# Patient Record
Sex: Male | Born: 1965 | Race: Black or African American | Hispanic: No | Marital: Married | Smoking: Current every day smoker
Health system: Southern US, Community
[De-identification: ages and names within clinical notes are randomized; demographics above are authoritative.]

## PROBLEM LIST (undated history)

## (undated) DIAGNOSIS — I10 Essential (primary) hypertension: Secondary | ICD-10-CM

## (undated) HISTORY — PX: NECK SURGERY: SHX720

## (undated) HISTORY — PX: SHOULDER SURGERY: SHX246

---

## 1998-10-30 ENCOUNTER — Ambulatory Visit (HOSPITAL_COMMUNITY): Admission: RE | Admit: 1998-10-30 | Discharge: 1998-10-30 | Payer: Self-pay | Admitting: Neurology

## 1998-10-30 ENCOUNTER — Encounter: Payer: Self-pay | Admitting: Neurology

## 1999-04-23 ENCOUNTER — Ambulatory Visit (HOSPITAL_COMMUNITY): Admission: RE | Admit: 1999-04-23 | Discharge: 1999-04-23 | Payer: Self-pay | Admitting: Specialist

## 1999-04-23 ENCOUNTER — Encounter: Payer: Self-pay | Admitting: Specialist

## 2003-06-11 ENCOUNTER — Emergency Department (HOSPITAL_COMMUNITY): Admission: EM | Admit: 2003-06-11 | Discharge: 2003-06-11 | Payer: Self-pay | Admitting: Emergency Medicine

## 2004-07-29 ENCOUNTER — Ambulatory Visit: Payer: Self-pay | Admitting: Family Medicine

## 2005-11-27 IMAGING — US ABDOMEN ULTRASOUND
1 series · 17 of 25 positions shown · non-contrast
Comparison: none

REASON FOR EXAM: Abnormal liver function tests
COMMENTS:

[Series 1: abdomen ultrasound · 17 of 53 slices shown]
[im 1/53]
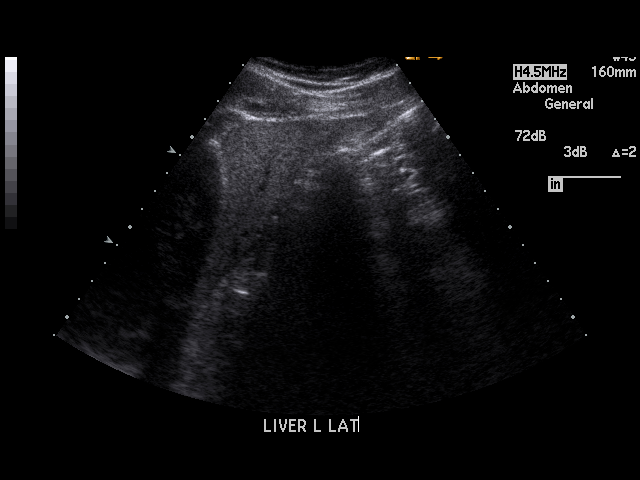
[im 5/53]
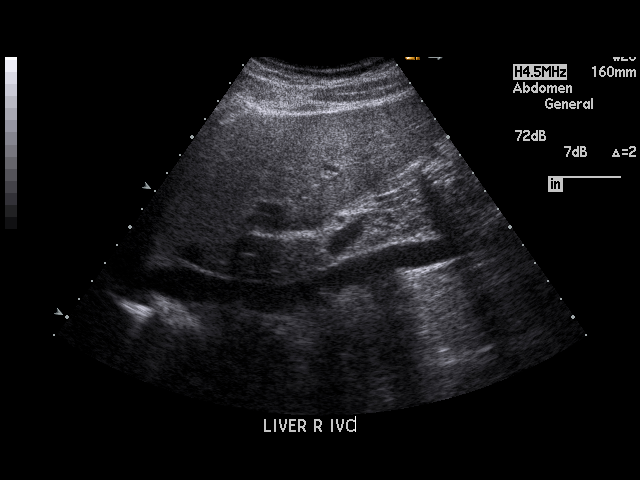
[im 7/53]
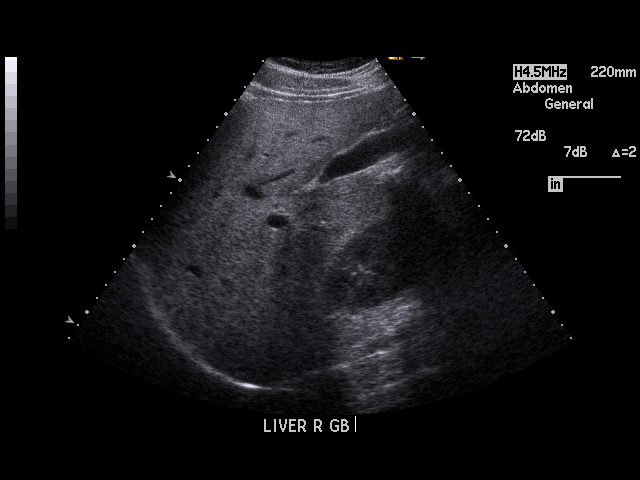
[im 11/53]
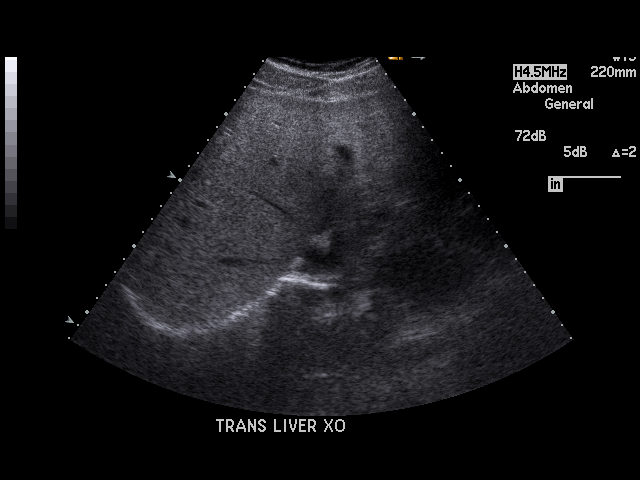
[im 14/53]
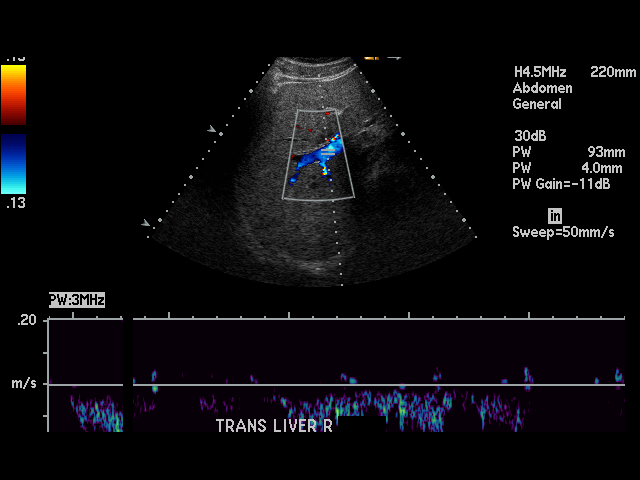
[im 18/53]
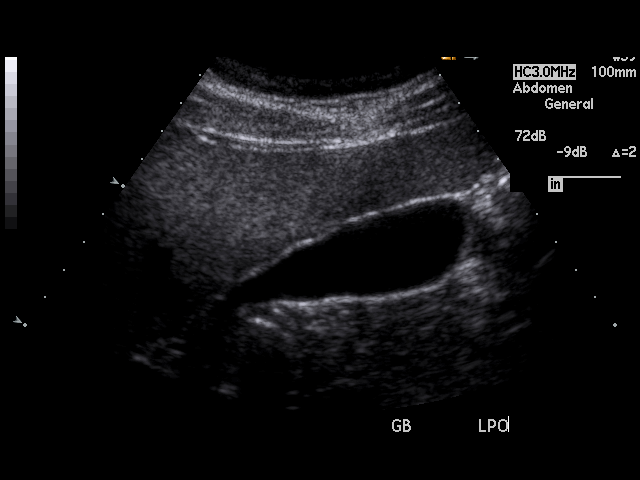
[im 20/53]
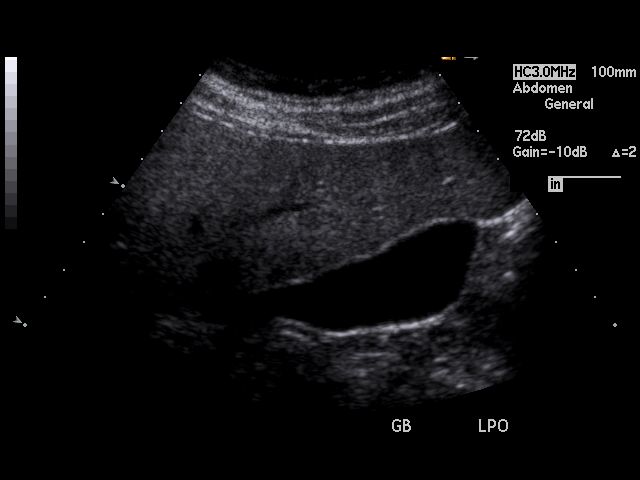
[im 24/53]
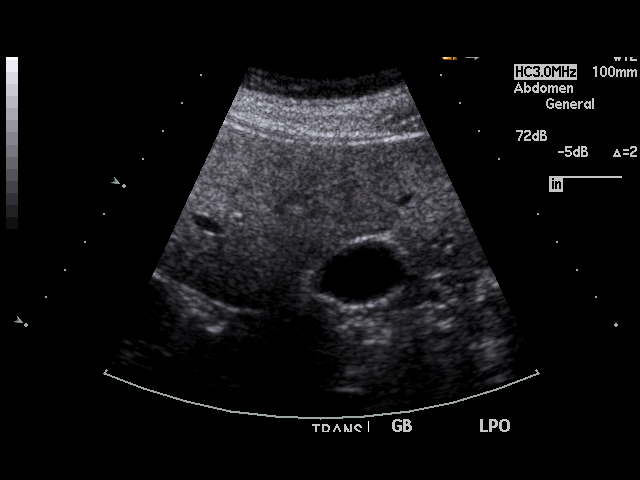
[im 27/53]
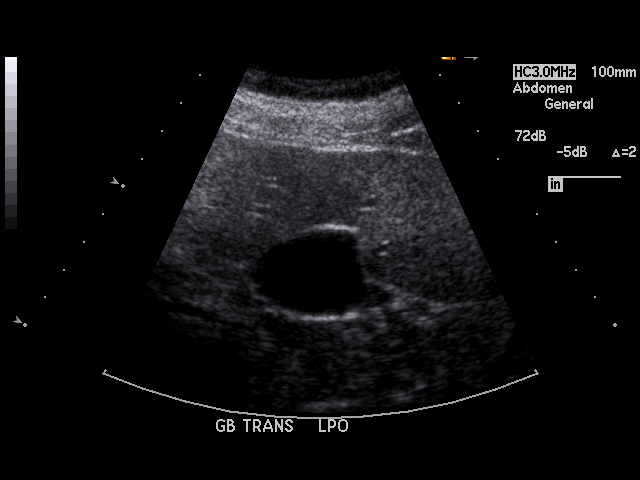
[im 29/53]
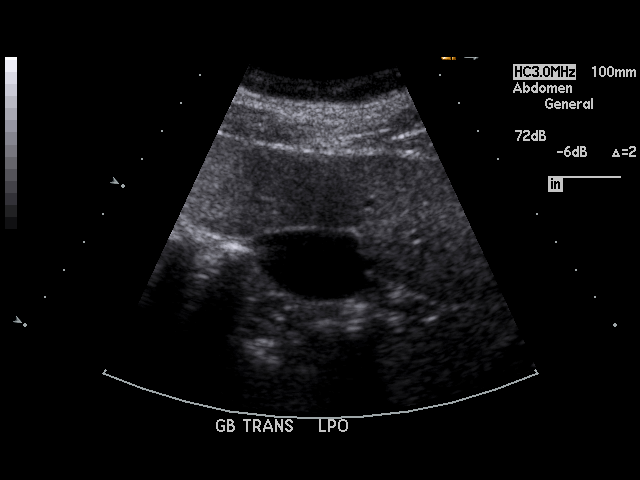
[im 33/53]
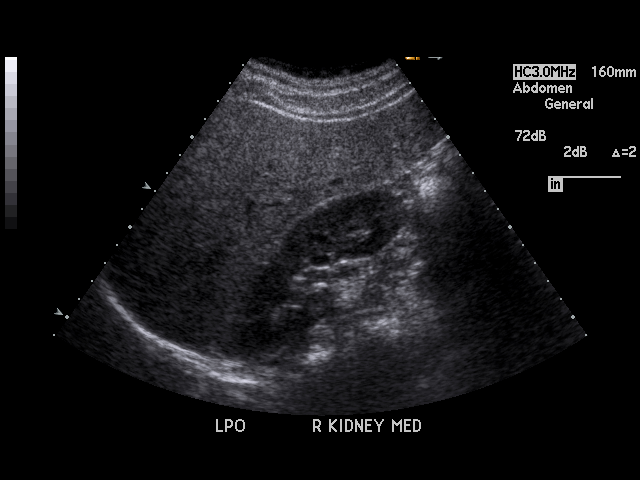
[im 35/53]
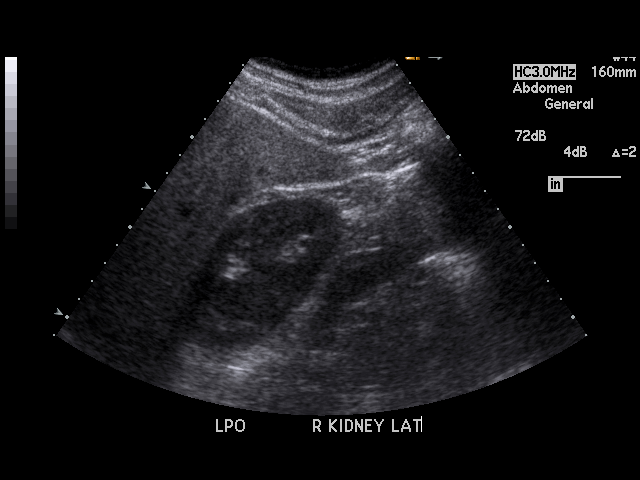
[im 40/53]
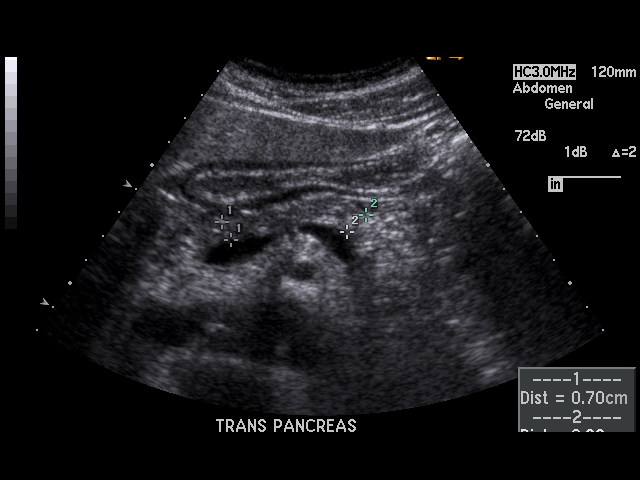
[im 42/53]
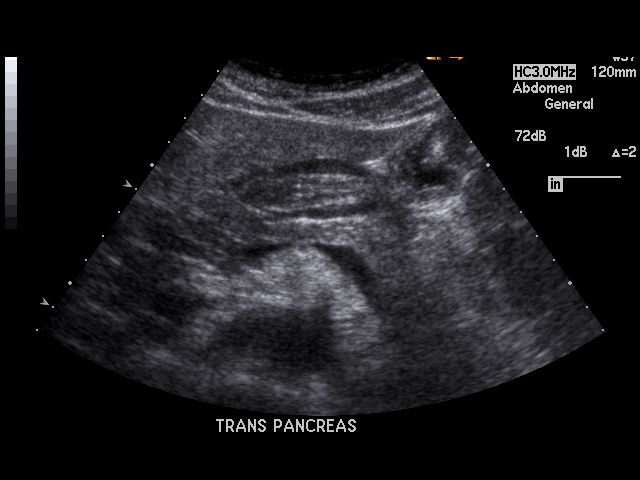
[im 46/53]
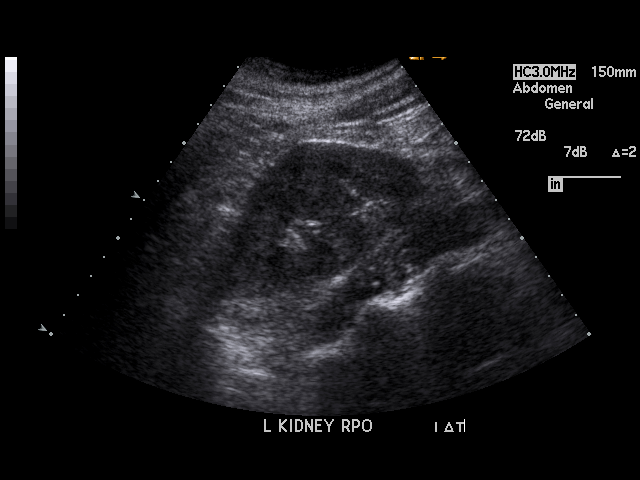
[im 48/53]
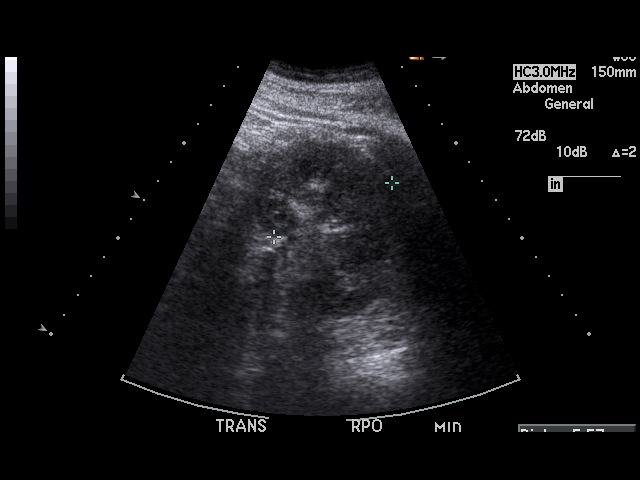
[im 53/53]
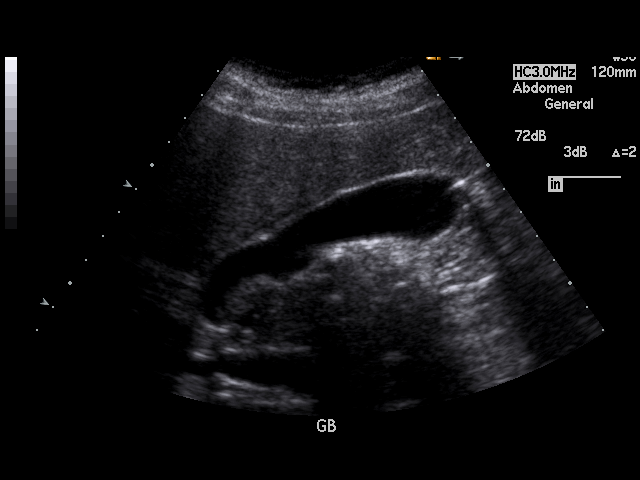

[17 of 25 positions shown; findings below may reference images not displayed]

PROCEDURE:     US  - US ABDOMEN GENERAL SURVEY  - July 29, 2004 [DATE]

RESULT:     Real-time imaging was obtained.  The gallbladder is well
identified with no gallstones seen.  The common duct appears within normal
limits in size.

The pancreas is visualized and no focal mass is seen.

The liver appears somewhat echogenic which can be compatible with fatty
infiltration.  The spleen is normal in size with appropriate echo texture.
No hydronephrosis of either kidney is noted.
IMPRESSION: Possible fatty infiltration of the liver or diffuse hepatocellular disease,
otherwise no significant abnormalities are seen.

## 2006-07-31 ENCOUNTER — Emergency Department (HOSPITAL_COMMUNITY): Admission: EM | Admit: 2006-07-31 | Discharge: 2006-07-31 | Payer: Self-pay | Admitting: Family Medicine

## 2007-02-05 ENCOUNTER — Emergency Department (HOSPITAL_COMMUNITY): Admission: EM | Admit: 2007-02-05 | Discharge: 2007-02-05 | Payer: Self-pay | Admitting: Emergency Medicine

## 2011-08-08 ENCOUNTER — Ambulatory Visit (HOSPITAL_COMMUNITY)
Admission: RE | Admit: 2011-08-08 | Discharge: 2011-08-08 | Disposition: A | Discharge status: Home / Self Care | Payer: 59 | Source: Ambulatory Visit | Attending: Internal Medicine | Admitting: Internal Medicine

## 2011-08-08 ENCOUNTER — Other Ambulatory Visit (HOSPITAL_COMMUNITY): Payer: Self-pay | Admitting: Internal Medicine

## 2011-08-08 DIAGNOSIS — M549 Dorsalgia, unspecified: Secondary | ICD-10-CM

## 2011-08-08 DIAGNOSIS — M5380 Other specified dorsopathies, site unspecified: Secondary | ICD-10-CM | POA: Insufficient documentation

## 2011-08-08 DIAGNOSIS — M542 Cervicalgia: Secondary | ICD-10-CM | POA: Insufficient documentation

## 2011-08-08 DIAGNOSIS — M79609 Pain in unspecified limb: Secondary | ICD-10-CM | POA: Insufficient documentation

## 2011-11-02 ENCOUNTER — Encounter (HOSPITAL_COMMUNITY): Payer: Self-pay | Admitting: Pharmacy Technician

## 2011-11-04 ENCOUNTER — Other Ambulatory Visit: Payer: Self-pay | Admitting: Neurosurgery

## 2011-11-04 ENCOUNTER — Encounter (HOSPITAL_COMMUNITY): Payer: Self-pay

## 2011-11-04 ENCOUNTER — Encounter (HOSPITAL_COMMUNITY)
Admission: RE | Admit: 2011-11-04 | Discharge: 2011-11-04 | Disposition: A | Discharge status: Home / Self Care | Payer: 59 | Source: Ambulatory Visit | Attending: Anesthesiology | Admitting: Anesthesiology

## 2011-11-04 ENCOUNTER — Encounter (HOSPITAL_COMMUNITY)
Admission: RE | Admit: 2011-11-04 | Discharge: 2011-11-04 | Disposition: A | Discharge status: Home / Self Care | Payer: 59 | Source: Ambulatory Visit | Attending: Neurosurgery | Admitting: Neurosurgery

## 2011-11-04 HISTORY — DX: Essential (primary) hypertension: I10

## 2011-11-04 LAB — BASIC METABOLIC PANEL
BUN: 10 mg/dL (ref 6–23)
CO2: 27 mEq/L (ref 19–32)
Calcium: 9.3 mg/dL (ref 8.4–10.5)
Chloride: 103 mEq/L (ref 96–112)
Creatinine, Ser: 0.79 mg/dL (ref 0.50–1.35)
Glucose, Bld: 101 mg/dL — ABNORMAL HIGH (ref 70–99)

## 2011-11-04 LAB — CBC
HCT: 40.7 % (ref 39.0–52.0)
Hemoglobin: 14 g/dL (ref 13.0–17.0)
MCH: 32.7 pg (ref 26.0–34.0)
MCHC: 34.4 g/dL (ref 30.0–36.0)
MCV: 95.1 fL (ref 78.0–100.0)
RDW: 12.4 % (ref 11.5–15.5)

## 2011-11-04 NOTE — Pre-Procedure Instructions (Signed)
22 Alexander Mills  11/04/2011   Your procedure is scheduled on:  Friday November 11, 2011  Report to [facility] at 9:45 AM.  Call this number if you have problems the morning of surgery: [phone]   Remember:   Do not eat food or drink: After Midnight.    Take these medicines the morning of surgery with A SIP OF WATER: Norco   Do not wear jewelry, make-up or nail polish.  Do not wear lotions, powders, or perfumes. You may wear deodorant.  Do not shave 48 hours prior to surgery. Men may shave face and neck.  Do not bring valuables to the hospital.  Contacts, dentures or bridgework may not be worn into surgery.  Leave suitcase in the car. After surgery it may be brought to your room.  For patients admitted to the hospital, checkout time is 11:00 AM the day of discharge.   Patients discharged the day of surgery will not be allowed to drive home.  Name and phone number of your driver: [name] [phone]  Special Instructions: CHG Shower Use Special Wash: 1/2 bottle night before surgery and 1/2 bottle morning of surgery.   Please read over the following fact sheets that you were given: Pain Booklet, Coughing and Deep Breathing, MRSA Information and Surgical Site Infection Prevention

## 2011-11-07 NOTE — Progress Notes (Signed)
Spoke with Erie Noe and requested orders-will let Dr.Pool know

## 2011-11-10 MED ORDER — DEXAMETHASONE SODIUM PHOSPHATE 10 MG/ML IJ SOLN
10.0000 mg | INTRAMUSCULAR | Status: AC
  Administered 2011-11-11: 10 mg via INTRAVENOUS
  Filled 2011-11-10: qty 1

## 2011-11-10 MED ORDER — CEFAZOLIN SODIUM-DEXTROSE 2-3 GM-% IV SOLR
2.0000 g | INTRAVENOUS | Status: AC
  Administered 2011-11-11: 2 g via INTRAVENOUS
  Filled 2011-11-10: qty 50

## 2011-11-11 ENCOUNTER — Ambulatory Visit (HOSPITAL_COMMUNITY): Payer: 59

## 2011-11-11 ENCOUNTER — Encounter (HOSPITAL_COMMUNITY): Payer: Self-pay | Admitting: Anesthesiology

## 2011-11-11 ENCOUNTER — Encounter (HOSPITAL_COMMUNITY)
Admission: RE | Disposition: A | Discharge status: Home / Self Care | Payer: Self-pay | Source: Ambulatory Visit | Attending: Neurosurgery

## 2011-11-11 ENCOUNTER — Ambulatory Visit (HOSPITAL_COMMUNITY)
Admission: RE | Admit: 2011-11-11 | Discharge: 2011-11-11 | Disposition: A | Discharge status: Home / Self Care | Payer: 59 | Source: Ambulatory Visit | Attending: Neurosurgery | Admitting: Neurosurgery

## 2011-11-11 ENCOUNTER — Ambulatory Visit (HOSPITAL_COMMUNITY): Payer: 59 | Admitting: Anesthesiology

## 2011-11-11 ENCOUNTER — Encounter (HOSPITAL_COMMUNITY): Payer: Self-pay | Admitting: Neurosurgery

## 2011-11-11 DIAGNOSIS — I10 Essential (primary) hypertension: Secondary | ICD-10-CM | POA: Insufficient documentation

## 2011-11-11 DIAGNOSIS — M4712 Other spondylosis with myelopathy, cervical region: Secondary | ICD-10-CM | POA: Insufficient documentation

## 2011-11-11 DIAGNOSIS — M4802 Spinal stenosis, cervical region: Principal | ICD-10-CM | POA: Insufficient documentation

## 2011-11-11 HISTORY — PX: ANTERIOR CERVICAL DECOMP/DISCECTOMY FUSION: SHX1161

## 2011-11-11 SURGERY — ANTERIOR CERVICAL DECOMPRESSION/DISCECTOMY FUSION 1 LEVEL
Anesthesia: General | Wound class: Clean

## 2011-11-11 MED ORDER — HYDROMORPHONE HCL PF 1 MG/ML IJ SOLN
0.5000 mg | INTRAMUSCULAR | Status: DC | PRN
  Administered 2011-11-11: 1 mg via INTRAVENOUS
  Filled 2011-11-11: qty 1

## 2011-11-11 MED ORDER — HEMOSTATIC AGENTS (NO CHARGE) OPTIME
TOPICAL | Status: DC | PRN
  Administered 2011-11-11: 1 via TOPICAL

## 2011-11-11 MED ORDER — 0.9 % SODIUM CHLORIDE (POUR BTL) OPTIME
TOPICAL | Status: DC | PRN
  Administered 2011-11-11: 1000 mL

## 2011-11-11 MED ORDER — CYCLOBENZAPRINE HCL 10 MG PO TABS
ORAL_TABLET | ORAL | Status: AC
  Filled 2011-11-11: qty 1

## 2011-11-11 MED ORDER — ZOLPIDEM TARTRATE 5 MG PO TABS: 5.0000 mg | ORAL_TABLET | Freq: Every evening | ORAL | Status: DC | PRN

## 2011-11-11 MED ORDER — LACTATED RINGERS IV SOLN
INTRAVENOUS | Status: DC | PRN
  Administered 2011-11-11 (×2): via INTRAVENOUS

## 2011-11-11 MED ORDER — OXYCODONE-ACETAMINOPHEN 5-325 MG PO TABS
ORAL_TABLET | ORAL | Status: AC
  Filled 2011-11-11: qty 1

## 2011-11-11 MED ORDER — MIDAZOLAM HCL 5 MG/5ML IJ SOLN
INTRAMUSCULAR | Status: DC | PRN
  Administered 2011-11-11: 2 mg via INTRAVENOUS

## 2011-11-11 MED ORDER — SENNA 8.6 MG PO TABS: 1.0000 | ORAL_TABLET | Freq: Two times a day (BID) | ORAL | Status: DC

## 2011-11-11 MED ORDER — CEFAZOLIN SODIUM 1-5 GM-% IV SOLN
1.0000 g | Freq: Three times a day (TID) | INTRAVENOUS | Status: DC
  Filled 2011-11-11 (×2): qty 50

## 2011-11-11 MED ORDER — BACITRACIN 50000 UNITS IM SOLR
INTRAMUSCULAR | Status: AC
  Filled 2011-11-11: qty 1

## 2011-11-11 MED ORDER — NEOSTIGMINE METHYLSULFATE 1 MG/ML IJ SOLN
INTRAMUSCULAR | Status: DC | PRN
  Administered 2011-11-11: 3 mg via INTRAVENOUS

## 2011-11-11 MED ORDER — LIDOCAINE HCL (CARDIAC) 20 MG/ML IV SOLN
INTRAVENOUS | Status: DC | PRN
  Administered 2011-11-11: 100 mg via INTRAVENOUS

## 2011-11-11 MED ORDER — CYCLOBENZAPRINE HCL 10 MG PO TABS: 10.0000 mg | ORAL_TABLET | Freq: Three times a day (TID) | ORAL | Status: AC | PRN

## 2011-11-11 MED ORDER — HYDROCODONE-ACETAMINOPHEN 5-325 MG PO TABS: 1.0000 | ORAL_TABLET | ORAL | Status: AC | PRN

## 2011-11-11 MED ORDER — HYDROMORPHONE HCL PF 1 MG/ML IJ SOLN: 0.2500 mg | INTRAMUSCULAR | Status: DC | PRN

## 2011-11-11 MED ORDER — FENTANYL CITRATE 0.05 MG/ML IJ SOLN
INTRAMUSCULAR | Status: DC | PRN
  Administered 2011-11-11: 200 ug via INTRAVENOUS
  Administered 2011-11-11 (×2): 50 ug via INTRAVENOUS

## 2011-11-11 MED ORDER — ACETAMINOPHEN 325 MG PO TABS: 650.0000 mg | ORAL_TABLET | ORAL | Status: DC | PRN

## 2011-11-11 MED ORDER — SODIUM CHLORIDE 0.9 % IV SOLN
INTRAVENOUS | Status: AC
  Filled 2011-11-11: qty 500

## 2011-11-11 MED ORDER — OXYCODONE-ACETAMINOPHEN 5-325 MG PO TABS
1.0000 | ORAL_TABLET | ORAL | Status: DC | PRN
  Administered 2011-11-11: 1 via ORAL

## 2011-11-11 MED ORDER — ACETAMINOPHEN 650 MG RE SUPP: 650.0000 mg | RECTAL | Status: DC | PRN

## 2011-11-11 MED ORDER — SODIUM CHLORIDE 0.9 % IJ SOLN
3.0000 mL | Freq: Two times a day (BID) | INTRAMUSCULAR | Status: DC
  Administered 2011-11-11: 3 mL via INTRAVENOUS

## 2011-11-11 MED ORDER — PHENOL 1.4 % MT LIQD: 1.0000 | OROMUCOSAL | Status: DC | PRN

## 2011-11-11 MED ORDER — HYDROCODONE-ACETAMINOPHEN 5-325 MG PO TABS: 1.0000 | ORAL_TABLET | ORAL | Status: DC | PRN

## 2011-11-11 MED ORDER — SODIUM CHLORIDE 0.9 % IR SOLN
Status: DC | PRN
  Administered 2011-11-11: 14:00:00

## 2011-11-11 MED ORDER — PROPOFOL 10 MG/ML IV EMUL
INTRAVENOUS | Status: DC | PRN
  Administered 2011-11-11: 200 mg via INTRAVENOUS

## 2011-11-11 MED ORDER — ONDANSETRON HCL 4 MG/2ML IJ SOLN
INTRAMUSCULAR | Status: DC | PRN
  Administered 2011-11-11: 4 mg via INTRAVENOUS

## 2011-11-11 MED ORDER — THROMBIN 5000 UNITS EX SOLR
CUTANEOUS | Status: DC | PRN
  Administered 2011-11-11: 5000 [IU] via TOPICAL

## 2011-11-11 MED ORDER — SODIUM CHLORIDE 0.9 % IJ SOLN: 3.0000 mL | INTRAMUSCULAR | Status: DC | PRN

## 2011-11-11 MED ORDER — ALUM & MAG HYDROXIDE-SIMETH 200-200-20 MG/5ML PO SUSP: 30.0000 mL | Freq: Four times a day (QID) | ORAL | Status: DC | PRN

## 2011-11-11 MED ORDER — ONDANSETRON HCL 4 MG/2ML IJ SOLN: 4.0000 mg | Freq: Once | INTRAMUSCULAR | Status: DC | PRN

## 2011-11-11 MED ORDER — CYCLOBENZAPRINE HCL 10 MG PO TABS
10.0000 mg | ORAL_TABLET | Freq: Three times a day (TID) | ORAL | Status: DC | PRN
  Administered 2011-11-11: 10 mg via ORAL

## 2011-11-11 MED ORDER — MENTHOL 3 MG MT LOZG
1.0000 | LOZENGE | OROMUCOSAL | Status: DC | PRN
  Filled 2011-11-11: qty 9

## 2011-11-11 MED ORDER — ONDANSETRON HCL 4 MG/2ML IJ SOLN: 4.0000 mg | INTRAMUSCULAR | Status: DC | PRN

## 2011-11-11 MED ORDER — GLYCOPYRROLATE 0.2 MG/ML IJ SOLN
INTRAMUSCULAR | Status: DC | PRN
  Administered 2011-11-11: .6 mg via INTRAVENOUS

## 2011-11-11 MED ORDER — VECURONIUM BROMIDE 10 MG IV SOLR
INTRAVENOUS | Status: DC | PRN
  Administered 2011-11-11: 10 mg via INTRAVENOUS

## 2011-11-11 SURGICAL SUPPLY — 58 items
BAG DECANTER FOR FLEXI CONT (MISCELLANEOUS) ×2 IMPLANT
BENZOIN TINCTURE PRP APPL 2/3 (GAUZE/BANDAGES/DRESSINGS) ×2 IMPLANT
BRUSH SCRUB EZ PLAIN DRY (MISCELLANEOUS) ×2 IMPLANT
BUR MATCHSTICK NEURO 3.0 LAGG (BURR) ×2 IMPLANT
CANISTER SUCTION 2500CC (MISCELLANEOUS) ×2 IMPLANT
CLOTH BEACON ORANGE TIMEOUT ST (SAFETY) ×2 IMPLANT
CONT SPEC 4OZ CLIKSEAL STRL BL (MISCELLANEOUS) ×2 IMPLANT
DERMABOND ADVANCED (GAUZE/BANDAGES/DRESSINGS) ×1
DERMABOND ADVANCED .7 DNX12 (GAUZE/BANDAGES/DRESSINGS) ×1 IMPLANT
DRAPE C-ARM 42X72 X-RAY (DRAPES) ×4 IMPLANT
DRAPE LAPAROTOMY 100X72 PEDS (DRAPES) ×2 IMPLANT
DRAPE MICROSCOPE LEICA (MISCELLANEOUS) ×2 IMPLANT
DRAPE MICROSCOPE ZEISS OPMI (DRAPES) IMPLANT
DRAPE POUCH INSTRU U-SHP 10X18 (DRAPES) ×2 IMPLANT
DRILL BIT (BIT) ×2 IMPLANT
ELECT COATED BLADE 2.86 ST (ELECTRODE) ×2 IMPLANT
ELECT REM PT RETURN 9FT ADLT (ELECTROSURGICAL) ×2
ELECTRODE REM PT RTRN 9FT ADLT (ELECTROSURGICAL) ×1 IMPLANT
GAUZE SPONGE 4X4 16PLY XRAY LF (GAUZE/BANDAGES/DRESSINGS) IMPLANT
GLOVE BIO SURGEON STRL SZ8.5 (GLOVE) ×2 IMPLANT
GLOVE BIOGEL PI IND STRL 7.0 (GLOVE) ×1 IMPLANT
GLOVE BIOGEL PI INDICATOR 7.0 (GLOVE) ×1
GLOVE ECLIPSE 8.5 STRL (GLOVE) ×2 IMPLANT
GLOVE EXAM NITRILE LRG STRL (GLOVE) IMPLANT
GLOVE EXAM NITRILE MD LF STRL (GLOVE) ×2 IMPLANT
GLOVE EXAM NITRILE XL STR (GLOVE) IMPLANT
GLOVE EXAM NITRILE XS STR PU (GLOVE) IMPLANT
GLOVE SS BIOGEL STRL SZ 6.5 (GLOVE) ×3 IMPLANT
GLOVE SS BIOGEL STRL SZ 8 (GLOVE) ×1 IMPLANT
GLOVE SUPERSENSE BIOGEL SZ 6.5 (GLOVE) ×3
GLOVE SUPERSENSE BIOGEL SZ 8 (GLOVE) ×1
GOWN BRE IMP SLV AUR LG STRL (GOWN DISPOSABLE) ×2 IMPLANT
GOWN BRE IMP SLV AUR XL STRL (GOWN DISPOSABLE) ×6 IMPLANT
GOWN STRL REIN 2XL LVL4 (GOWN DISPOSABLE) IMPLANT
HEAD HALTER (SOFTGOODS) ×2 IMPLANT
KIT BASIN OR (CUSTOM PROCEDURE TRAY) ×2 IMPLANT
KIT ROOM TURNOVER OR (KITS) ×2 IMPLANT
NEEDLE SPNL 20GX3.5 QUINCKE YW (NEEDLE) ×2 IMPLANT
NS IRRIG 1000ML POUR BTL (IV SOLUTION) ×2 IMPLANT
PACK LAMINECTOMY NEURO (CUSTOM PROCEDURE TRAY) ×2 IMPLANT
PAD ARMBOARD 7.5X6 YLW CONV (MISCELLANEOUS) ×6 IMPLANT
PLATE ELITE VISION 25MM (Plate) ×2 IMPLANT
RUBBERBAND STERILE (MISCELLANEOUS) ×4 IMPLANT
SCREW ST 13X4XST VA NS SPNE (Screw) ×4 IMPLANT
SCREW ST VAR 4 ATL (Screw) ×4 IMPLANT
SPACER BONE CORNERSTONE 7X14 (Orthopedic Implant) ×2 IMPLANT
SPONGE GAUZE 4X4 12PLY (GAUZE/BANDAGES/DRESSINGS) ×2 IMPLANT
SPONGE INTESTINAL PEANUT (DISPOSABLE) ×2 IMPLANT
SPONGE SURGIFOAM ABS GEL SZ50 (HEMOSTASIS) ×2 IMPLANT
STRIP CLOSURE SKIN 1/2X4 (GAUZE/BANDAGES/DRESSINGS) ×2 IMPLANT
SUT PDS AB 5-0 P3 18 (SUTURE) ×2 IMPLANT
SUT VIC AB 3-0 SH 8-18 (SUTURE) ×2 IMPLANT
SYR 20ML ECCENTRIC (SYRINGE) ×2 IMPLANT
TAPE CLOTH 4X10 WHT NS (GAUZE/BANDAGES/DRESSINGS) IMPLANT
TAPE CLOTH SURG 4X10 WHT LF (GAUZE/BANDAGES/DRESSINGS) ×2 IMPLANT
TOWEL OR 17X24 6PK STRL BLUE (TOWEL DISPOSABLE) ×2 IMPLANT
TOWEL OR 17X26 10 PK STRL BLUE (TOWEL DISPOSABLE) ×2 IMPLANT
WATER STERILE IRR 1000ML POUR (IV SOLUTION) ×2 IMPLANT

## 2011-11-11 NOTE — Anesthesia Postprocedure Evaluation (Signed)
Anesthesia Post Note  Patient: Alexander Mills  Procedure(s) Performed: Procedure(s) (LRB): ANTERIOR CERVICAL DECOMPRESSION/DISCECTOMY FUSION 1 LEVEL (N/A)  Anesthesia type: general  Patient location: PACU  Post pain: Pain level controlled  Post assessment: Patient's Cardiovascular Status Stable  Last Vitals:  Filed Vitals:   11/11/11 1430  BP:   Pulse:   Temp: 36.4 C  Resp:     Post vital signs: Reviewed and stable  Level of consciousness: sedated  Complications: No apparent anesthesia complications

## 2011-11-11 NOTE — Anesthesia Preprocedure Evaluation (Addendum)
Anesthesia Evaluation  Patient identified by MRN, date of birth, ID band Patient awake    Reviewed: Allergy & Precautions, H&P , NPO status , Patient's Chart, lab work & pertinent test results  Airway Mallampati: I TM Distance: >3 FB Neck ROM: Full    Dental  (+) Dental Advidsory Given and Teeth Intact   Pulmonary          Cardiovascular hypertension, Pt. on medications     Neuro/Psych    GI/Hepatic   Endo/Other    Renal/GU      Musculoskeletal   Abdominal   Peds  Hematology   Anesthesia Other Findings   Reproductive/Obstetrics                          Anesthesia Physical Anesthesia Plan  ASA: II  Anesthesia Plan: General   Post-op Pain Management:    Induction: Intravenous  Airway Management Planned: Oral ETT  Additional Equipment:   Intra-op Plan:   Post-operative Plan: Extubation in OR  Informed Consent: I have reviewed the patients History and Physical, chart, labs and discussed the procedure including the risks, benefits and alternatives for the proposed anesthesia with the patient or authorized representative who has indicated his/her understanding and acceptance.   Dental Advisory Given  Plan Discussed with: CRNA, Surgeon and Anesthesiologist  Anesthesia Plan Comments:        Anesthesia Quick Evaluation

## 2011-11-11 NOTE — Transfer of Care (Signed)
Immediate Anesthesia Transfer of Care Note  Patient: Alexander Mills  Procedure(s) Performed: Procedure(s) (LRB): ANTERIOR CERVICAL DECOMPRESSION/DISCECTOMY FUSION 1 LEVEL (N/A)  Patient Location: PACU  Anesthesia Type: General  Level of Consciousness: awake, alert  and oriented  Airway & Oxygen Therapy: Patient Spontanous Breathing and Patient connected to nasal cannula oxygen  Post-op Assessment: Report given to PACU RN, Post -op Vital signs reviewed and stable and Patient moving all extremities X 4  Post vital signs: Reviewed and stable  Complications: No apparent anesthesia complications

## 2011-11-11 NOTE — Preoperative (Signed)
Beta Blockers   Reason not to administer Beta Blockers:Not Applicable 

## 2011-11-11 NOTE — Anesthesia Procedure Notes (Signed)
Procedure Name: MAC Date/Time: 11/11/2011 12:54 PM Performed by: Carmela Rima Pre-anesthesia Checklist: Patient identified, Timeout performed, Emergency Drugs available, Suction available and Patient being monitored Patient Re-evaluated:Patient Re-evaluated prior to inductionOxygen Delivery Method: Circle system utilized Preoxygenation: Pre-oxygenation with 100% oxygen Intubation Type: IV induction Ventilation: Mask ventilation without difficulty Laryngoscope Size: Mac, 3 and 4 Grade View: Grade II Tube type: Oral Tube size: 7.5 mm Number of attempts: 2 Placement Confirmation: ETT inserted through vocal cords under direct vision,  positive ETCO2 and breath sounds checked- equal and bilateral Secured at: 23 cm Tube secured with: Tape Dental Injury: Teeth and Oropharynx as per pre-operative assessment

## 2011-11-11 NOTE — Op Note (Signed)
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  Asst. Surgeon: Lovell Sheehan  Anesthesia: General  Indication: 46 year old male with severe neck pain and bilateral shoulder pain failing conservative management her workup demonstrates evidence of stenosis and instability at C3-4. Patient status post previous C4-5 anterior cervical fusion. Patient's failed conservative management and presents now for C3-4 anterior cervical discectomy and fusion with allograft and her plating.  Operative note: After induction anesthesia, patient positioned supine with Extended and held in place of halter traction. Patient's anterior cervical region was prepped and draped sterilely. 10 blade is used to make a linear skin incision overlying the C3-4 level. This carried down sharply to the platysma. Is then divided vertically and dissection proceeded on U. border of the sternocleidomastoid muscle and carotid sheath. Trachea and esophagus are mobilized track towards the left. Prevertebral fascia stripped off the anterior spinal column. Longus: Was elevated bilaterally/Carter. Deep self-retaining are displaced interoperative fluoroscopy is used levels were confirmed. The space at C3-4 then incised 15 blade in a rectangular fashion. Y. disc space cleanout achieved using pituitary rongeurs forward and backward L. Carlin curettes Kerrison rongeurs high-speed drill prominence of the disc removed down to level posterior and azithromycin then brought field these at the remainder of the discectomy remaining aspects of annulus and osteophytes removed using a high-speed drill down to the level of the posterior longitudinal ligament. Posterior lateral and was elevated and resected in piecemeal  fashion using Kerrison rongeurs. Underlying thecal sac was identified. A wide central decompression and perform undercutting the bodies of C3 and C4. Decompression proceeded out dirt each neural foramen. Wide anterior foraminotomies were performed on course exiting C4 nerve roots bilaterally. This point a very thorough depression achieved. There is no his injury thecal sac and a repair wounds that area out solution. Gelfoam was placed topically for hemostasis. 7 mm cornerstone allograft wedges and packed into place recessed roughly 1 mm from the intervertebral margin. 25 mg in a figure cervical plate was then placed over the C3 and C4 levels. This is an attachment or fluoroscopic guidance and 13 mm barrel screws 2 each at both levels were all 4 screws given a final tightening down to be solidly within bone. Locking screws into both levels. Final images real good position the varus or proper upper level normal and spine. Wounds that area one final time. Hemostasis was assured with bipolar chart was and close in layers with Vicryl sutures. Steri-Strips and sterile dressing were applied. There were no apparent complications. Patient tolerated the procedure well and returned to recovery postop.

## 2011-11-11 NOTE — Brief Op Note (Signed)
11/11/2011  2:17 PM  PATIENT:  Alexander Mills  46 y.o. male  PRE-OPERATIVE DIAGNOSIS:  stenosis  POST-OPERATIVE DIAGNOSIS:  stenosis  PROCEDURE:  Procedure(s) (LRB): ANTERIOR CERVICAL DECOMPRESSION/DISCECTOMY FUSION 1 LEVEL (N/A)  SURGEON:  Surgeon(s) and Role:    * Temple Pacini, MD - Primary    * Cristi Loron, MD - Assisting  PHYSICIAN ASSISTANT:   ASSISTANTS:    ANESTHESIA:   general  EBL:  Total I/O In: 1000 [I.V.:1000] Out: 200 [Blood:200]  BLOOD ADMINISTERED:none  DRAINS: none   LOCAL MEDICATIONS USED:  NONE  SPECIMEN:  No Specimen  DISPOSITION OF SPECIMEN:  N/A  COUNTS:  YES  TOURNIQUET:  * No tourniquets in log *  DICTATION: .Dragon Dictation  PLAN OF CARE: Admit to inpatient   PATIENT DISPOSITION:  PACU - hemodynamically stable.   Delay start of Pharmacological VTE agent (>24hrs) due to surgical blood loss or risk of bleeding: yes

## 2011-11-11 NOTE — Progress Notes (Signed)
PT. UP AD LIB, TOLERATING DIET AND PO PAIN MEDICATIONS. V/S AT 1910 B/P 137/85, R=16, P=88, TEMP 97.7, SAT 96% ON ROOM AIR. PT. VERBALIZED UNDERSTANDING OF DISCHARGE ORDERS.  FAMILY AND STAFF ASSISTING PT. TO CAR AND FAMILY ASSISTING PT. HOME.  NO DISTRESS NOTED. CHRIS Vishaal Strollo RN

## 2011-11-11 NOTE — H&P (Signed)
Alexander Mills is an 46 y.o. male.   Chief Complaint: Neck and bilateral shoulder pain HPI: 46 year old male with significant neck and bilateral shoulder pain failing conservative management following a accident. Patient is status post previous C4-5 anterior cervical fusion. Workup demonstrates evidence of kyphosis and instability with stenosis at C3-4. Patient presents now for C3 for decompression and fusion. Patient has no weakness sensory loss or bowel or bladder symptoms.  Past Medical History  Diagnosis Date  . Hypertension     diet, exercise controlled     Past Surgical History  Procedure Date  . Neck surgery   . Shoulder surgery     October 28, 2011    No family history on file. Social History:  reports that he has been smoking Cigarettes.  He has a 15 pack-year smoking history. He does not have any smokeless tobacco history on file. He reports that he drinks alcohol. He reports that he does not use illicit drugs.  Allergies: No Known Allergies  Medications Prior to Admission  Medication Sig Dispense Refill  . HYDROcodone-acetaminophen (NORCO) 5-325 MG per tablet Take 1 tablet by mouth every 6 (six) hours as needed. For pain      . ibuprofen (ADVIL,MOTRIN) 200 MG tablet Take 400 mg by mouth every 6 (six) hours as needed. For pain        No results found for this or any previous visit (from the past 48 hour(s)). No results found.  Review of Systems  Constitutional: Negative.   HENT: Negative.   Eyes: Negative.   Respiratory: Negative.   Cardiovascular: Negative.   Gastrointestinal: Negative.   Genitourinary: Negative.   Musculoskeletal: Negative.   Skin: Negative.   Neurological: Negative.   Endo/Heme/Allergies: Negative.   Psychiatric/Behavioral: Negative.     Blood pressure 126/87, pulse 78, temperature 98.8 F (37.1 C), temperature source Oral, resp. rate 18, SpO2 99.00%. Physical Exam  Constitutional: He is oriented to person, place, and time. He appears  well-developed and well-nourished. No distress.  HENT:  Head: Normocephalic and atraumatic.  Right Ear: External ear normal.  Left Ear: External ear normal.  Nose: Nose normal.  Mouth/Throat: Oropharynx is clear and moist.  Eyes: Conjunctivae and EOM are normal. Pupils are equal, round, and reactive to light. Right eye exhibits no discharge. Left eye exhibits no discharge.  Neck: Normal range of motion. Neck supple. No tracheal deviation present. No thyromegaly present.  Cardiovascular: Normal rate, regular rhythm, normal heart sounds and intact distal pulses.   Respiratory: Effort normal and breath sounds normal. No respiratory distress. He has no wheezes.  GI: Soft. Bowel sounds are normal. He exhibits no distension. There is no tenderness.  Musculoskeletal: Normal range of motion. He exhibits no edema and no tenderness.  Neurological: He is alert and oriented to person, place, and time. He has normal reflexes. No cranial nerve deficit. Coordination normal.  Skin: Skin is warm and dry. No rash noted. He is not diaphoretic. No erythema. No pallor.  Psychiatric: He has a normal mood and affect. His behavior is normal. Judgment and thought content normal.     Assessment/Plan C3-4 stenosis with instability. Plan C3-4 anterior cervical discectomy and fusion with allograft and anterior plating. Risks and benefits have been explained. Patient wishes to proceed.  Birgitta Uhlir A 11/11/2011, 12:26 PM

## 2011-11-11 NOTE — Discharge Summary (Signed)
Physician Discharge Summary  Patient ID: Alexander Mills MRN: [id number] DOB/AGE: 44-Nov-1967 44 y.o.  Admit date: 11/11/2011 Discharge date: 11/11/2011  Admission Diagnoses:  Discharge Diagnoses:  Principal Problem:  *Cervical stenosis of spinal canal   Discharged Condition: good  Hospital Course: Admitted to the hospital where he underwent a C3-4 anterior cervical setting fusion with allograft and her plating without any complication. Postoperative done well. He is ready for discharge home.  Consults:   Significant Diagnostic Studies:   Treatments:   Discharge Exam: Blood pressure 156/98, pulse 65, temperature 98.1 F (36.7 C), temperature source Oral, resp. rate 18, SpO2 94.00%. Awake and alert oriented and appropriate. Cranial nerve function is intact. Motor and sensory function of the extremities is normal. Wound clean dry and intact. Chest and abdomen benign. Next soft.  Disposition: Final discharge disposition not confirmed   Medication List  As of 11/11/2011  6:34 PM   TAKE these medications         cyclobenzaprine 10 MG tablet   Commonly known as: FLEXERIL   Take 1 tablet (10 mg total) by mouth 3 (three) times daily as needed for muscle spasms.      HYDROcodone-acetaminophen 5-325 MG per tablet   Commonly known as: NORCO/VICODIN   Take 1 tablet by mouth every 6 (six) hours as needed. For pain      HYDROcodone-acetaminophen 5-325 MG per tablet   Commonly known as: NORCO/VICODIN   Take 1-2 tablets by mouth every 4 (four) hours as needed.      ibuprofen 200 MG tablet   Commonly known as: ADVIL,MOTRIN   Take 400 mg by mouth every 6 (six) hours as needed. For pain           Follow-up Information    Follow up with Wilburt Messina A, MD. Call in 1 week. (Ask for Lurena Joiner)    Contact information:   [address], [address] [place] [address] [phone]          Signed: Temple Pacini 11/11/2011, 6:34 PM

## 2011-11-14 ENCOUNTER — Encounter (HOSPITAL_COMMUNITY): Payer: Self-pay | Admitting: Neurosurgery

## 2013-03-04 IMAGING — CR DG CHEST 2V
2 series · 2 of 2 positions shown · non-contrast
Comparison: None.

CLINICAL DATA: Preoperative evaluation for anterior cervical
decompression and discectomy.  Hypertension.  15 pack year history
of smoking.

CHEST - 2 VIEW

[view not recorded (1 of 2)]
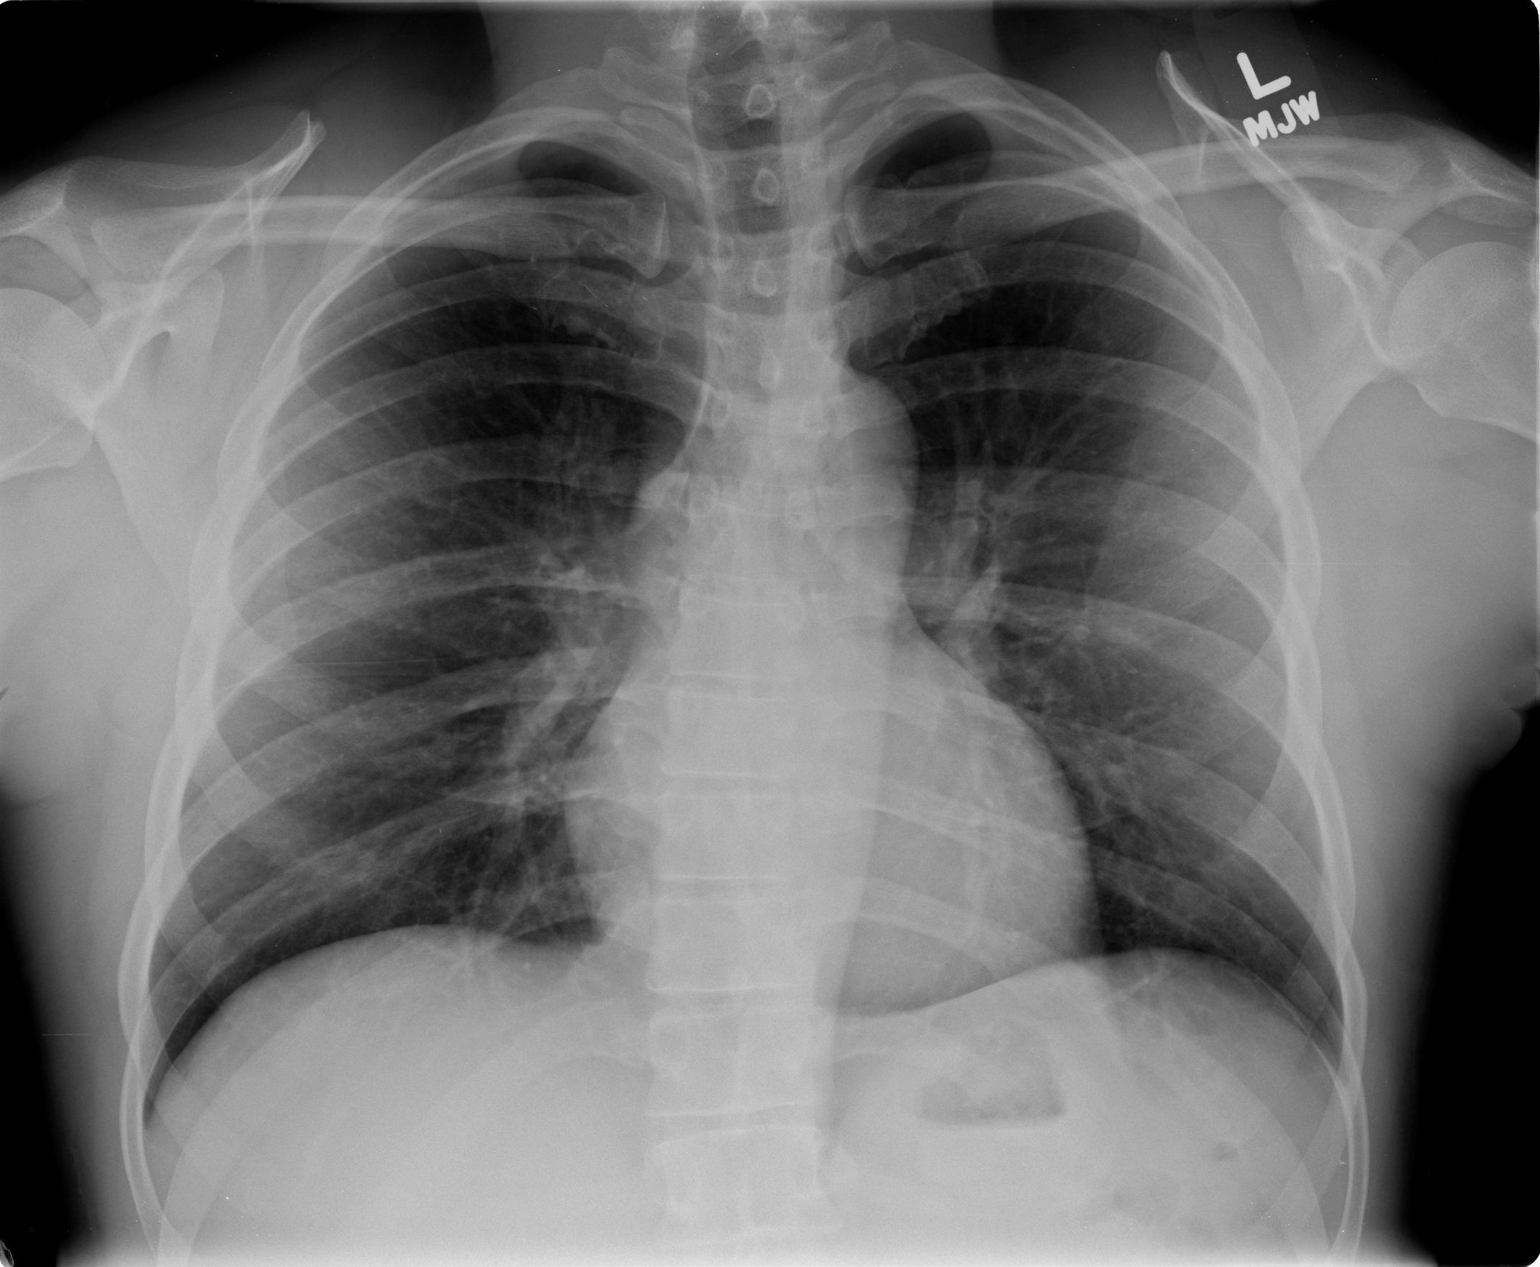

[view not recorded (2 of 2)]
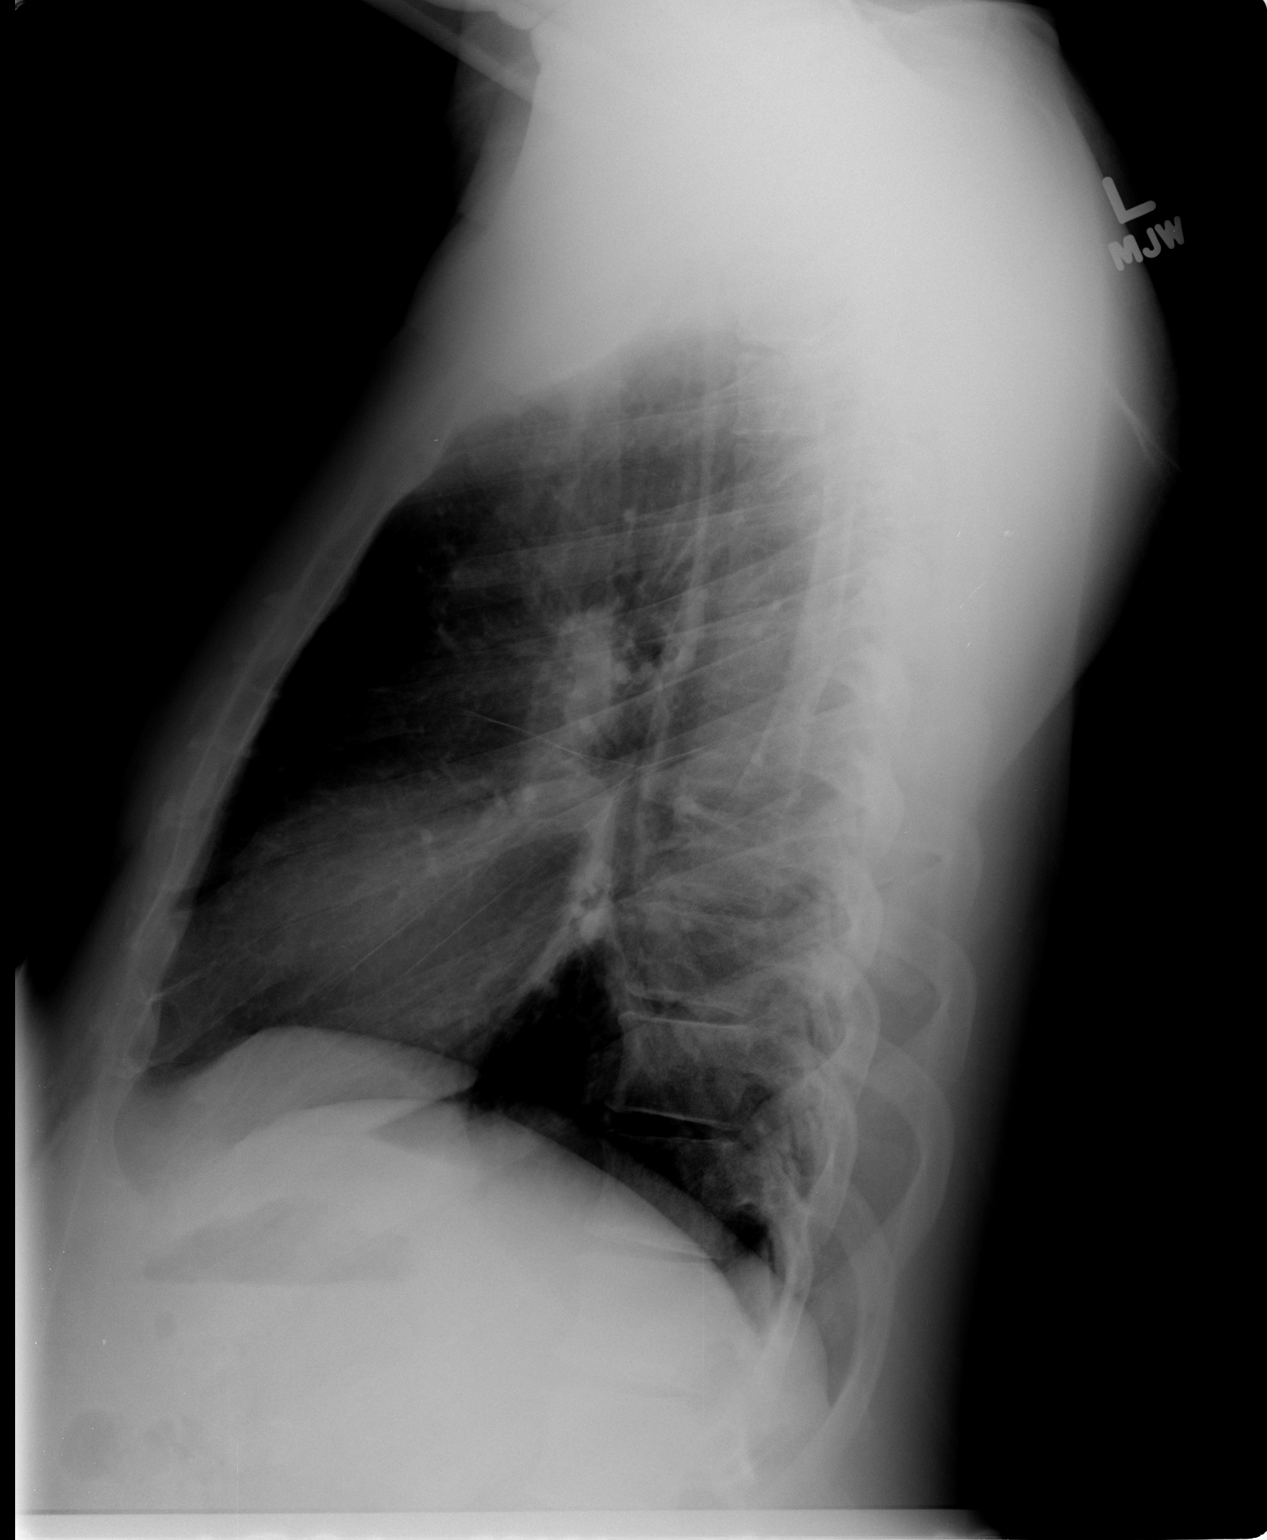

[2 of 2 positions shown; findings below may reference images not displayed]

FINDINGS: Heart and mediastinal contours are within normal limits.
The lung fields appear clear with no signs of focal infiltrate or
congestive failure.  No pleural fluid or significant peribronchial
cuffing is seen.

Bony structures appear intact.
IMPRESSION: No worrisome focal or acute cardiopulmonary abnormality identified

## 2017-03-10 DIAGNOSIS — E782 Mixed hyperlipidemia: Secondary | ICD-10-CM | POA: Diagnosis not present

## 2017-03-10 DIAGNOSIS — I10 Essential (primary) hypertension: Secondary | ICD-10-CM | POA: Diagnosis not present

## 2017-03-11 DIAGNOSIS — E875 Hyperkalemia: Secondary | ICD-10-CM | POA: Diagnosis not present

## 2017-03-11 DIAGNOSIS — E782 Mixed hyperlipidemia: Secondary | ICD-10-CM | POA: Diagnosis not present

## 2017-03-11 DIAGNOSIS — I10 Essential (primary) hypertension: Secondary | ICD-10-CM | POA: Diagnosis not present

## 2017-03-30 DIAGNOSIS — I10 Essential (primary) hypertension: Secondary | ICD-10-CM | POA: Diagnosis not present

## 2017-09-01 DIAGNOSIS — I10 Essential (primary) hypertension: Secondary | ICD-10-CM | POA: Diagnosis not present

## 2017-09-01 DIAGNOSIS — M25512 Pain in left shoulder: Secondary | ICD-10-CM | POA: Diagnosis not present

## 2017-09-01 DIAGNOSIS — E782 Mixed hyperlipidemia: Secondary | ICD-10-CM | POA: Diagnosis not present

## 2017-09-09 DIAGNOSIS — E782 Mixed hyperlipidemia: Secondary | ICD-10-CM | POA: Diagnosis not present

## 2017-09-09 DIAGNOSIS — I10 Essential (primary) hypertension: Secondary | ICD-10-CM | POA: Diagnosis not present

## 2017-09-09 DIAGNOSIS — R7301 Impaired fasting glucose: Secondary | ICD-10-CM | POA: Diagnosis not present

## 2018-03-16 DIAGNOSIS — R945 Abnormal results of liver function studies: Secondary | ICD-10-CM | POA: Diagnosis not present

## 2018-03-16 DIAGNOSIS — R7301 Impaired fasting glucose: Secondary | ICD-10-CM | POA: Diagnosis not present

## 2018-03-16 DIAGNOSIS — I10 Essential (primary) hypertension: Secondary | ICD-10-CM | POA: Diagnosis not present

## 2018-03-16 DIAGNOSIS — E782 Mixed hyperlipidemia: Secondary | ICD-10-CM | POA: Diagnosis not present

## 2018-04-06 DIAGNOSIS — E782 Mixed hyperlipidemia: Secondary | ICD-10-CM | POA: Diagnosis not present

## 2018-04-06 DIAGNOSIS — R945 Abnormal results of liver function studies: Secondary | ICD-10-CM | POA: Diagnosis not present

## 2018-04-06 DIAGNOSIS — I10 Essential (primary) hypertension: Secondary | ICD-10-CM | POA: Diagnosis not present

## 2018-05-03 ENCOUNTER — Ambulatory Visit: Payer: Self-pay

## 2019-10-29 ENCOUNTER — Encounter: Payer: Self-pay | Admitting: *Deleted

## 2019-12-26 ENCOUNTER — Ambulatory Visit: Payer: 59

## 2020-02-12 ENCOUNTER — Ambulatory Visit: Payer: 59

## 2020-03-23 ENCOUNTER — Other Ambulatory Visit: Payer: Self-pay

## 2020-03-23 ENCOUNTER — Ambulatory Visit (INDEPENDENT_AMBULATORY_CARE_PROVIDER_SITE_OTHER): Payer: Self-pay | Admitting: *Deleted

## 2020-03-23 VITALS — Ht 69.0 in | Wt 205.2 lb

## 2020-03-23 DIAGNOSIS — Z1211 Encounter for screening for malignant neoplasm of colon: Secondary | ICD-10-CM

## 2020-03-23 MED ORDER — NA SULFATE-K SULFATE-MG SULF 17.5-3.13-1.6 GM/177ML PO SOLN: 1.0000 | Freq: Once | ORAL | 0 refills | Status: AC

## 2020-03-23 NOTE — Patient Instructions (Addendum)
Alexander Mills  1966-04-23 MRN: [id number]     Procedure Date:  05/04/2020 Time to register:  7:00 am Place to register: [facility] Procedure Time:  8:30 am Scheduled provider: Dr. Abbey Chatters    PREPARATION FOR COLONOSCOPY WITH SUPREP BOWEL PREP KIT  Note: Suprep Bowel Prep Kit is a split-dose (2day) regimen. Consumption of BOTH 6-ounce bottles is required for a complete prep.  Please notify us immediately if you are diabetic, take iron supplements, or if you are on Coumadin or any other blood thinners.  Please hold the following medications: n/a                                                                                                                                                  2 DAYS BEFORE PROCEDURE:  DATE:  05/02/2020   DAY: Saturday Begin clear liquid diet AFTER your lunch meal. NO SOLID FOODS after this point.  1 DAY BEFORE PROCEDURE:  DATE: 05/03/2020   DAY: Sunday Continue clear liquids the entire day - NO SOLID FOOD.   Diabetic medications adjustments for today: n/a  At 6:00pm: Complete steps 1 through 4 below, using ONE (1) 6-ounce bottle, before going to bed. Step 1:  Pour ONE (1) 6-ounce bottle of SUPREP liquid into the mixing container.  Step 2:  Add cool drinking water to the 16 ounce line on the container and mix.  Note: Dilute the solution concentrate as directed prior to use. Step 3:  DRINK ALL the liquid in the container. Step 4:  You MUST drink an additional two (2) or more 16 ounce containers of water over the next one (1) hour.   Continue clear liquids.  DAY OF PROCEDURE:   DATE:  05/04/2020  DAY: Monday If you take medications for your heart, blood pressure, or breathing, you may take these medications.  Diabetic medications adjustments for today: n/a  5 hours before your procedure at :  3:30 am Step 1:  Pour ONE (1) 6-ounce bottle of SUPREP liquid into the mixing container.  Step 2:  Add cool drinking water to the 16 ounce line on the  container and mix.  Note: Dilute the solution concentrate as directed prior to use. Step 3:  DRINK ALL the liquid in the container. Step 4:  You MUST drink an additional two (2) or more 16 ounce containers of water over the next one (1) hour. You MUST complete the final glass of water at least 3 hours before your colonoscopy. Nothing by mouth past 5:30 am.  You may take your morning medications with sip of water unless we have instructed otherwise.    Please see below for Dietary Information.  CLEAR LIQUIDS INCLUDE:  Water Jello (NOT red in color)   Ice Popsicles (NOT red in color)   Tea (sugar ok, no milk/cream) Powdered fruit flavored  drinks  Coffee (sugar ok, no milk/cream) Gatorade/ Lemonade/ Kool-Aid  (NOT red in color)   Juice: apple, white grape, white cranberry Soft drinks  Clear bullion, consomme, broth (fat free beef/chicken/vegetable)  Carbonated beverages (any kind)  Strained chicken noodle soup Hard Candy   Remember: Clear liquids are liquids that will allow you to see your fingers on the other side of a clear glass. Be sure liquids are NOT red in color, and not cloudy, but CLEAR.  DO NOT EAT OR DRINK ANY OF THE FOLLOWING:  Dairy products of any kind   Cranberry juice Tomato juice / V8 juice   Grapefruit juice Orange juice     Red grape juice  Do not eat any solid foods, including such foods as: cereal, oatmeal, yogurt, fruits, vegetables, creamed soups, eggs, bread, crackers, pureed foods in a blender, etc.   HELPFUL HINTS FOR DRINKING PREP SOLUTION:   Make sure prep is extremely cold. Mix and refrigerate the the morning of the prep. You may also put in the freezer.   You may try mixing some Crystal Light or Country Time Lemonade if you prefer. Mix in small amounts; add more if necessary.  Try drinking through a straw  Rinse mouth with water or a mouthwash between glasses, to remove after-taste.  Try sipping on a cold beverage /ice/ popsicles between glasses of  prep.  Place a piece of sugar-free hard candy in mouth between glasses.  If you become nauseated, try consuming smaller amounts, or stretch out the time between glasses. Stop for 30-60 minutes, then slowly start back drinking.     OTHER INSTRUCTIONS  You will need a responsible adult at least 52 years of age to accompany you and drive you home. This person must remain in the waiting room during your procedure. The hospital will cancel your procedure if you do not have a responsible adult with you.   1. Wear loose fitting clothing that is easily removed. 2. Leave jewelry and other valuables at home.  3. Remove all body piercing jewelry and leave at home. 4. Total time from sign-in until discharge is approximately 2-3 hours. 5. You should go home directly after your procedure and rest. You can resume normal activities the day after your procedure. 6. The day of your procedure you should not:  Drive  Make legal decisions  Operate machinery  Drink alcohol  Return to work   You may call the office (Dept: [phone]) before 5:00pm, or page the doctor on call [phone]) after 5:00pm, for further instructions, if necessary.   Insurance Information YOU WILL NEED TO CHECK WITH YOUR INSURANCE COMPANY FOR THE BENEFITS OF COVERAGE YOU HAVE FOR THIS PROCEDURE.  UNFORTUNATELY, NOT ALL INSURANCE COMPANIES HAVE BENEFITS TO COVER ALL OR PART OF THESE TYPES OF PROCEDURES.  IT IS YOUR RESPONSIBILITY TO CHECK YOUR BENEFITS, HOWEVER, WE WILL BE GLAD TO ASSIST YOU WITH ANY CODES YOUR INSURANCE COMPANY MAY NEED.    PLEASE NOTE THAT MOST INSURANCE COMPANIES WILL NOT COVER A SCREENING COLONOSCOPY FOR PEOPLE UNDER THE AGE OF 50  IF YOU HAVE BC[employer] INSURANCE, YOU MAY HAVE BENEFITS FOR A SCREENING COLONOSCOPY BUT IF POLYPS ARE FOUND THE DIAGNOSIS WILL CHANGE AND THEN YOU MAY HAVE A DEDUCTIBLE THAT WILL NEED TO BE MET. SO PLEASE MAKE SURE YOU CHECK YOUR BENEFITS FOR A SCREENING COLONOSCOPY AS WELL AS A  DIAGNOSTIC COLONOSCOPY.

## 2020-03-23 NOTE — Progress Notes (Addendum)
Gastroenterology Pre-Procedure Review  Request Date: 03/23/2020 Requesting Physician: Dr. Wende Neighbors, no previous TCS  PATIENT REVIEW QUESTIONS: The patient responded to the following health history questions as indicated:    1. Diabetes Melitis: no 2. Joint replacements in the past 12 months: no 3. Major health problems in the past 3 months: no 4. Has an artificial valve or MVP: no 5. Has a defibrillator: no 6. Has been advised in past to take antibiotics in advance of a procedure like teeth cleaning: no 7. Family history of colon cancer: no  8. Alcohol Use: yes, 3 or 4 beers a week 9. Illicit drug Use: no 10. History of sleep apnea: no  11. History of coronary artery or other vascular stents placed within the last 12 months: no 12. History of any prior anesthesia complications: no 13. Body mass index is 30.3 kg/m.    MEDICATIONS & ALLERGIES:    Patient reports the following regarding taking any blood thinners:   Plavix? no Aspirin? no Coumadin? no Brilinta? no Xarelto? no Eliquis? no Pradaxa? no Savaysa? no Effient? no  Patient confirms/reports the following medications:  No current outpatient medications on file.   No current facility-administered medications for this visit.    Patient confirms/reports the following allergies:  No Known Allergies  No orders of the defined types were placed in this encounter.   [place] INFORMATION Primary Insurance: [facility],  [place] #: [id number],  Group #: [id number] Pre-Cert / Auth required: Yes, approved 6/57/9038-3/33/8329 per Barton Dubois / Auth #: [id number]  SCHEDULE INFORMATION: Procedure has been scheduled as follows:  Date: 05/04/2020 , Time: 8:30 Location: [facility] with Dr. Abbey Chatters  This Gastroenterology Pre-Precedure Review Form is being routed to the following provider(s): Neil Crouch, PA

## 2020-03-27 NOTE — Progress Notes (Signed)
Ok to schedule conscious sedation. ASA II.  °

## 2020-03-30 ENCOUNTER — Other Ambulatory Visit: Payer: Self-pay | Admitting: *Deleted

## 2020-03-30 NOTE — Progress Notes (Signed)
[facility] patient. Ok to schedule propofol. ASA II.

## 2020-05-01 ENCOUNTER — Other Ambulatory Visit (HOSPITAL_COMMUNITY)
Admission: RE | Admit: 2020-05-01 | Discharge: 2020-05-01 | Disposition: A | Discharge status: Home / Self Care | Payer: 59 | Source: Ambulatory Visit | Attending: Internal Medicine | Admitting: Internal Medicine

## 2020-05-01 ENCOUNTER — Other Ambulatory Visit: Payer: Self-pay

## 2020-05-01 DIAGNOSIS — Z20822 Contact with and (suspected) exposure to covid-19: Secondary | ICD-10-CM | POA: Diagnosis not present

## 2020-05-01 DIAGNOSIS — Z01818 Encounter for other preprocedural examination: Secondary | ICD-10-CM | POA: Insufficient documentation

## 2020-05-01 LAB — SARS CORONAVIRUS 2 (TAT 6-24 HRS): SARS Coronavirus 2: NEGATIVE

## 2020-05-04 ENCOUNTER — Other Ambulatory Visit: Payer: Self-pay

## 2020-05-04 ENCOUNTER — Encounter (HOSPITAL_COMMUNITY): Payer: Self-pay

## 2020-05-04 ENCOUNTER — Ambulatory Visit (HOSPITAL_COMMUNITY): Payer: 59 | Admitting: Anesthesiology

## 2020-05-04 ENCOUNTER — Encounter (HOSPITAL_COMMUNITY)
Admission: RE | Disposition: A | Discharge status: Home / Self Care | Payer: Self-pay | Source: Home / Self Care | Attending: Internal Medicine

## 2020-05-04 ENCOUNTER — Ambulatory Visit (HOSPITAL_COMMUNITY)
Admission: RE | Admit: 2020-05-04 | Discharge: 2020-05-04 | Disposition: A | Discharge status: Home / Self Care | Payer: 59 | Attending: Internal Medicine | Admitting: Internal Medicine

## 2020-05-04 DIAGNOSIS — D122 Benign neoplasm of ascending colon: Secondary | ICD-10-CM | POA: Insufficient documentation

## 2020-05-04 DIAGNOSIS — K635 Polyp of colon: Secondary | ICD-10-CM

## 2020-05-04 DIAGNOSIS — K5730 Diverticulosis of large intestine without perforation or abscess without bleeding: Secondary | ICD-10-CM | POA: Insufficient documentation

## 2020-05-04 DIAGNOSIS — D123 Benign neoplasm of transverse colon: Secondary | ICD-10-CM | POA: Diagnosis not present

## 2020-05-04 DIAGNOSIS — K648 Other hemorrhoids: Secondary | ICD-10-CM | POA: Diagnosis not present

## 2020-05-04 DIAGNOSIS — Z1211 Encounter for screening for malignant neoplasm of colon: Secondary | ICD-10-CM | POA: Diagnosis present

## 2020-05-04 HISTORY — PX: COLONOSCOPY WITH PROPOFOL: SHX5780

## 2020-05-04 HISTORY — PX: POLYPECTOMY: SHX5525

## 2020-05-04 SURGERY — COLONOSCOPY WITH PROPOFOL
Anesthesia: General

## 2020-05-04 MED ORDER — CHLORHEXIDINE GLUCONATE CLOTH 2 % EX PADS: 6.0000 | MEDICATED_PAD | Freq: Once | CUTANEOUS | Status: DC

## 2020-05-04 MED ORDER — PROPOFOL 500 MG/50ML IV EMUL
INTRAVENOUS | Status: DC | PRN
  Administered 2020-05-04: 150 ug/kg/min via INTRAVENOUS

## 2020-05-04 MED ORDER — PROPOFOL 10 MG/ML IV BOLUS
INTRAVENOUS | Status: DC | PRN
  Administered 2020-05-04 (×2): 50 mg via INTRAVENOUS

## 2020-05-04 MED ORDER — STERILE WATER FOR IRRIGATION IR SOLN
Status: DC | PRN
  Administered 2020-05-04: 1.5 mL

## 2020-05-04 MED ORDER — LACTATED RINGERS IV SOLN: INTRAVENOUS | Status: DC | PRN

## 2020-05-04 MED ORDER — LACTATED RINGERS IV SOLN: Freq: Once | INTRAVENOUS | Status: AC

## 2020-05-04 NOTE — Op Note (Signed)
[facility] Patient Name: Alexander Mills Procedure Date: 05/04/2020 8:06 AM MRN: [id number] Date of Birth: 1965/10/14 Attending MD: Elon Alas. Edgar Frisk CSN: [id number] Age: 55 Admit Type: Outpatient Procedure:                Colonoscopy Indications:              Screening for colorectal malignant neoplasm Providers:                Elon Alas. Abbey Chatters, DO, Tacy Learn,                            Technician Referring MD:              Medicines:                See the Anesthesia note for documentation of the                            administered medications Complications:            No immediate complications. Estimated Blood Loss:     Estimated blood loss was minimal. Procedure:                Pre-Anesthesia Assessment:                           - The anesthesia plan was to use monitored                            anesthesia care (MAC).                           After obtaining informed consent, the colonoscope                            was passed under direct vision. Throughout the                            procedure, the patient's blood pressure, pulse, and                            oxygen saturations were monitored continuously. The                            [id number]-[id number] ([id number]) scope was introduced through                            the anus and advanced to the the cecum, identified                            by appendiceal orifice and ileocecal valve. The                            colonoscopy was performed without difficulty. The                            patient tolerated the procedure well. The quality  of the bowel preparation was evaluated using the                            BBPS [facility] Bowel Preparation Scale) with scores                            of: Right Colon = 3, Transverse Colon = 3 and Left                            Colon = 3 (entire mucosa seen well with no residual                            staining, small  fragments of stool or opaque                            liquid). The total BBPS score equals 9. Scope In: 8:14:22 AM Scope Out: 8:28:43 AM Scope Withdrawal Time: 0 hours 10 minutes 32 seconds  Total Procedure Duration: 0 hours 14 minutes 21 seconds  Findings:      The perianal and digital rectal examinations were normal.      Non-bleeding internal hemorrhoids were found during endoscopy.      Scattered small-mouthed diverticula were found in the entire colon.      A 7 mm polyp was found in the ascending colon. The polyp was sessile.       The polyp was removed with a cold snare. Resection and retrieval were       complete.      A 10 mm polyp was found in the transverse colon. The polyp was sessile.       The polyp was removed with a cold snare. Resection and retrieval were       complete.      The exam was otherwise without abnormality. Impression:               - Non-bleeding internal hemorrhoids.                           - Diverticulosis in the entire examined colon.                           - One 7 mm polyp in the ascending colon, removed                            with a cold snare. Resected and retrieved.                           - One 10 mm polyp in the transverse colon, removed                            with a cold snare. Resected and retrieved.                           - The examination was otherwise normal. Moderate Sedation:      Per Anesthesia Care Recommendation:           - Patient  has a contact number available for                            emergencies. The signs and symptoms of potential                            delayed complications were discussed with the                            patient. Return to normal activities tomorrow.                            Written discharge instructions were provided to the                            patient.                           - Resume previous diet.                           - Continue present medications.                            - Await pathology results.                           - Repeat colonoscopy in 5 years for surveillance.                           - Return to GI clinic PRN. Procedure Code(s):        --- Professional ---                           [id number], Colonoscopy, flexible; with removal of                            tumor(s), polyp(s), or other lesion(s) by snare                            technique Diagnosis Code(s):        --- Professional ---                           Z12.11, Encounter for screening for malignant                            neoplasm of colon                           K64.8, Other hemorrhoids                           K63.5, Polyp of colon                           K57.30, Diverticulosis of large intestine without  perforation or abscess without bleeding CPT copyright 2019 American Medical Association. All rights reserved. The codes documented in this report are preliminary and upon coder review may  be revised to meet current compliance requirements. Elon Alas. Abbey Chatters, DO [place] Abbey Chatters, DO 05/04/2020 8:32:29 AM This report has been signed electronically. Number of Addenda: 0

## 2020-05-04 NOTE — H&P (Signed)
Primary Care Physician:  Celene Squibb, MD Primary Gastroenterologist:  Dr. Abbey Chatters  Pre-Procedure History & Physical: HPI:  Alexander Mills is a 55 y.o. male is here for a colonoscopy for colon cancer screening purposes.  Patient denies any family history of colorectal cancer.  No melena or hematochezia.  No abdominal pain or unintentional weight loss.  No change in bowel habits.  Overall feels well from a GI standpoint.  Past Medical History:  Diagnosis Date  . Hypertension    diet, exercise controlled     Past Surgical History:  Procedure Laterality Date  . ANTERIOR CERVICAL DECOMP/DISCECTOMY FUSION  11/11/2011   Procedure: ANTERIOR CERVICAL DECOMPRESSION/DISCECTOMY FUSION 1 LEVEL;  Surgeon: Charlie Pitter, MD;  Location: [facility];  Service: Neurosurgery;  Laterality: N/A;  Anterior Cervical Decompression/Discectomy Fusion Cervical Three-Four  . NECK SURGERY    . SHOULDER SURGERY     October 28, 2011    Prior to Admission medications   Not on File    Allergies as of 03/30/2020  . (No Known Allergies)    History reviewed. No pertinent family history.  Social History   Socioeconomic History  . Marital status: Married    Spouse name: Not on file  . Number of children: Not on file  . Years of education: Not on file  . Highest education level: Not on file  Occupational History  . Not on file  Tobacco Use  . Smoking status: Current Every Day Smoker    Packs/day: 1.00    Years: 15.00    Pack years: 15.00    Types: Cigarettes  . Smokeless tobacco: Never Used  Vaping Use  . Vaping Use: Never used  Substance and Sexual Activity  . Alcohol use: Yes    Comment: occassional  . Drug use: No  . Sexual activity: Yes  Other Topics Concern  . Not on file  Social History Narrative  . Not on file   Social Determinants of Health   Financial Resource Strain: Not on file  Food Insecurity: Not on file  Transportation Needs: Not on file  Physical Activity: Not on file  Stress:  Not on file  Social Connections: Not on file  Intimate Partner Violence: Not on file    Review of Systems: See HPI, otherwise negative ROS  Impression/Plan: Alexander Mills is here for a colonoscopy to be performed for colon cancer screening purposes.  The risks of the procedure including infection, bleed, or perforation as well as benefits, limitations, alternatives and imponderables have been reviewed with the patient. Questions have been answered. All parties agreeable.

## 2020-05-04 NOTE — Transfer of Care (Signed)
Immediate Anesthesia Transfer of Care Note  Patient: Alexander Mills  Procedure(s) Performed: COLONOSCOPY WITH PROPOFOL (N/A ) POLYPECTOMY  Patient Location: PACU  Anesthesia Type:General  Level of Consciousness: awake, alert , oriented and patient cooperative  Airway & Oxygen Therapy: Patient Spontanous Breathing  Post-op Assessment: Report given to RN, Post -op Vital signs reviewed and stable and Patient moving all extremities X 4  Post vital signs: Reviewed and stable  Last Vitals:  Vitals Value Taken Time  BP    Temp    Pulse    Resp    SpO2      Last Pain:  Vitals:   05/04/20 0832  TempSrc: Oral  PainSc: 0-No pain      Patients Stated Pain Goal: 10 (60/04/59 9774)  Complications: No complications documented.

## 2020-05-04 NOTE — Anesthesia Postprocedure Evaluation (Signed)
Anesthesia Post Note  Patient: Alexander Mills  Procedure(s) Performed: COLONOSCOPY WITH PROPOFOL (N/A ) POLYPECTOMY  Patient location during evaluation: Phase II Anesthesia Type: General Level of consciousness: awake, oriented, awake and alert and patient cooperative Pain management: satisfactory to patient Vital Signs Assessment: post-procedure vital signs reviewed and stable Respiratory status: spontaneous breathing, respiratory function stable and nonlabored ventilation Cardiovascular status: stable Postop Assessment: no apparent nausea or vomiting Anesthetic complications: no   No complications documented.   Last Vitals:  Vitals:   05/04/20 0734  BP: (!) 131/91  Pulse: 79  Resp: 17  Temp: 37.1 C  SpO2: 99%    Last Pain:  Vitals:   05/04/20 0832  TempSrc: Oral  PainSc: 0-No pain                 Jaydyn Menon

## 2020-05-04 NOTE — Anesthesia Preprocedure Evaluation (Addendum)
Anesthesia Evaluation  Patient identified by MRN, date of birth, ID band Patient awake    Reviewed: Allergy & Precautions, NPO status , Patient's Chart, lab work & pertinent test results  History of Anesthesia Complications Negative for: history of anesthetic complications  Airway       Comment: ACDF Dental  (+) Dental Advisory Given   Pulmonary Current Smoker and Patient abstained from smoking.,    Pulmonary exam normal breath sounds clear to auscultation       Cardiovascular Exercise Tolerance: Good hypertension (not on meds), Normal cardiovascular exam Rhythm:Regular Rate:Normal     Neuro/Psych negative neurological ROS  negative psych ROS   GI/Hepatic negative GI ROS, Neg liver ROS,   Endo/Other  negative endocrine ROS  Renal/GU negative Renal ROS     Musculoskeletal  (+) Arthritis  (CERVICAL SPINAL STENOSIS), ACDF   Abdominal   Peds  Hematology negative hematology ROS (+)   Anesthesia Other Findings   Reproductive/Obstetrics negative OB ROS                            Anesthesia Physical Anesthesia Plan  ASA: II  Anesthesia Plan: General   Post-op Pain Management:    Induction: Intravenous  PONV Risk Score and Plan: Treatment may vary due to age or medical condition  Airway Management Planned: Nasal Cannula and Natural Airway  Additional Equipment:   Intra-op Plan:   Post-operative Plan:   Informed Consent: I have reviewed the patients History and Physical, chart, labs and discussed the procedure including the risks, benefits and alternatives for the proposed anesthesia with the patient or authorized representative who has indicated his/her understanding and acceptance.       Plan Discussed with: CRNA and Surgeon  Anesthesia Plan Comments:        Anesthesia Quick Evaluation

## 2020-05-04 NOTE — Discharge Instructions (Addendum)
°Colonoscopy °Discharge Instructions ° °Read the instructions outlined below and refer to this sheet in the next few weeks. These discharge instructions provide you with general information on caring for yourself after you leave the hospital. Your doctor may also give you specific instructions. While your treatment has been planned according to the most current medical practices available, unavoidable complications occasionally occur.  ° °ACTIVITY °· You may resume your regular activity, but move at a slower pace for the next 24 hours.  °· Take frequent rest periods for the next 24 hours.  °· Walking will help get rid of the air and reduce the bloated feeling in your belly (abdomen).  °· No driving for 24 hours (because of the medicine (anesthesia) used during the test).   °· Do not sign any important legal documents or operate any machinery for 24 hours (because of the anesthesia used during the test).  °NUTRITION °· Drink plenty of fluids.  °· You may resume your normal diet as instructed by your doctor.  °· Begin with a light meal and progress to your normal diet. Heavy or fried foods are harder to digest and may make you feel sick to your stomach (nauseated).  °· Avoid alcoholic beverages for 24 hours or as instructed.  °MEDICATIONS °· You may resume your normal medications unless your doctor tells you otherwise.  °WHAT YOU CAN EXPECT TODAY °· Some feelings of bloating in the abdomen.  °· Passage of more gas than usual.  °· Spotting of blood in your stool or on the toilet paper.  °IF YOU HAD POLYPS REMOVED DURING THE COLONOSCOPY: °· No aspirin products for 7 days or as instructed.  °· No alcohol for 7 days or as instructed.  °· Eat a soft diet for the next 24 hours.  °FINDING OUT THE RESULTS OF YOUR TEST °Not all test results are available during your visit. If your test results are not back during the visit, make an appointment with your caregiver to find out the results. Do not assume everything is normal if  you have not heard from your caregiver or the medical facility. It is important for you to follow up on all of your test results.  °SEEK IMMEDIATE MEDICAL ATTENTION IF: °· You have more than a spotting of blood in your stool.  °· Your belly is swollen (abdominal distention).  °· You are nauseated or vomiting.  °· You have a temperature over 101.  °· You have abdominal pain or discomfort that is severe or gets worse throughout the day.  ° °Your colonoscopy revealed 2 polyp(s) which I removed successfully. Await pathology results, my office will contact you. I recommend repeating colonoscopy in 5 years for surveillance purposes. Follow up with GI as needed. ° ° °I hope you have a great rest of your week! ° °Zeric K. Carver, D.O. °Gastroenterology and Hepatology °[facility] ° ° °Colon Polyps ° °Colon polyps are tissue growths inside the colon, which is part of the large intestine. They are one of the types of polyps that can grow in the body. A polyp may be a round bump or a mushroom-shaped growth. You could have one polyp or more than one. °Most colon polyps are noncancerous (benign). However, some colon polyps can become cancerous over time. Finding and removing the polyps early can help prevent this. °What are the causes? °The exact cause of colon polyps is not known. °What increases the risk? °The following factors may make you more likely to develop this   condition: °· Having a family history of colorectal cancer or colon polyps. °· Being older than 53 years of age. °· Being younger than 53 years of age and having a significant family history of colorectal cancer or colon polyps or a genetic condition that puts you at higher risk of getting colon polyps. °· Having inflammatory bowel disease, such as ulcerative colitis or Crohn's disease. °· Having certain conditions passed from parent to child (hereditary conditions), such as: °? Familial adenomatous polyposis (FAP). °? Lynch  syndrome. °? Turcot syndrome. °? Peutz-Jeghers syndrome. °? MUTYH-associated polyposis (MAP). °· Being overweight. °· Certain lifestyle factors. These include smoking cigarettes, drinking too much alcohol, not getting enough exercise, and eating a diet that is high in fat and red meat and low in fiber. °· Having had childhood cancer that was treated with radiation of the abdomen. °What are the signs or symptoms? °Many times, there are no symptoms. °If you have symptoms, they may include: °· Blood coming from the rectum during a bowel movement. °· Blood in the stool (feces). The blood may be bright red or very dark in color. °· Pain in the abdomen. °· A change in bowel habits, such as constipation or diarrhea. °How is this diagnosed? °This condition is diagnosed with a colonoscopy. This is a procedure in which a lighted, flexible scope is inserted into the opening between the buttocks (anus) and then passed into the colon to examine the area. Polyps are sometimes found when a colonoscopy is done as part of routine cancer screening tests. °How is this treated? °This condition is treated by removing any polyps that are found. Most polyps can be removed during a colonoscopy. Those polyps will then be tested for cancer. Additional treatment may be needed depending on the results of testing. °Follow these instructions at home: °Eating and drinking °· Eat foods that are high in fiber, such as fruits, vegetables, and whole grains. °· Eat foods that are high in calcium and vitamin D, such as milk, cheese, yogurt, eggs, liver, fish, and broccoli. °· Limit foods that are high in fat, such as fried foods and desserts. °· Limit the amount of red meat, precooked or cured meat, or other processed meat that you eat, such as hot dogs, sausages, bacon, or meat loaves. °· Limit sugary drinks.   °Lifestyle °· Maintain a healthy weight, or lose weight if recommended by your health care provider. °· Exercise every day or as told by your  health care provider. °· Do not use any products that contain nicotine or tobacco, such as cigarettes, e-cigarettes, and chewing tobacco. If you need help quitting, ask your health care provider. °· Do not drink alcohol if: °? Your health care provider tells you not to drink. °? You are pregnant, may be pregnant, or are planning to become pregnant. °· If you drink alcohol: °? Limit how much you use to: °§ 0-1 drink a day for women. °§ 0-2 drinks a day for men. °? Know how much alcohol is in your drink. In the [place] one drink equals one 12 oz bottle of beer (355 mL), one 5 oz glass of wine (148 mL), or one 1½ oz glass of hard liquor (44 mL). °General instructions °· Take over-the-counter and prescription medicines only as told by your health care provider. °· Keep all follow-up visits. This is important. This includes having regularly scheduled colonoscopies. Talk to your health care provider about when you need a colonoscopy. °Contact a health care provider if: °· You   have new or worsening bleeding during a bowel movement. °· You have new or increased blood in your stool. °· You have a change in bowel habits. °· You lose weight for no known reason. °Summary °· Colon polyps are tissue growths inside the colon, which is part of the large intestine. They are one type of polyp that can grow in the body. °· Most colon polyps are noncancerous (benign), but some can become cancerous over time. °· This condition is diagnosed with a colonoscopy. °· This condition is treated by removing any polyps that are found. Most polyps can be removed during a colonoscopy. °This information is not intended to replace advice given to you by your health care provider. Make sure you discuss any questions you have with your health care provider. °Document Revised: 07/31/2019 Document Reviewed: 07/31/2019 °Elsevier Patient Education © 2021 [employer] ° ° °

## 2020-05-05 LAB — SURGICAL PATHOLOGY

## 2020-05-06 ENCOUNTER — Encounter (HOSPITAL_COMMUNITY): Payer: Self-pay | Admitting: Internal Medicine

## 2022-06-03 ENCOUNTER — Other Ambulatory Visit (HOSPITAL_COMMUNITY): Payer: Self-pay | Admitting: Internal Medicine

## 2022-06-03 DIAGNOSIS — E782 Mixed hyperlipidemia: Secondary | ICD-10-CM

## 2022-07-22 ENCOUNTER — Ambulatory Visit (HOSPITAL_COMMUNITY)
Admission: RE | Admit: 2022-07-22 | Discharge: 2022-07-22 | Disposition: A | Discharge status: Home / Self Care | Payer: 59 | Source: Ambulatory Visit | Attending: Internal Medicine | Admitting: Internal Medicine

## 2022-07-22 DIAGNOSIS — E782 Mixed hyperlipidemia: Secondary | ICD-10-CM | POA: Insufficient documentation

## 2022-12-02 ENCOUNTER — Encounter (HOSPITAL_COMMUNITY): Payer: Self-pay | Admitting: Internal Medicine

## 2022-12-14 ENCOUNTER — Other Ambulatory Visit (HOSPITAL_COMMUNITY): Payer: Self-pay | Admitting: Internal Medicine

## 2022-12-14 DIAGNOSIS — R918 Other nonspecific abnormal finding of lung field: Secondary | ICD-10-CM

## 2022-12-23 ENCOUNTER — Ambulatory Visit (HOSPITAL_BASED_OUTPATIENT_CLINIC_OR_DEPARTMENT_OTHER)
Admission: RE | Admit: 2022-12-23 | Discharge: 2022-12-23 | Disposition: A | Discharge status: Home / Self Care | Payer: 59 | Source: Ambulatory Visit | Attending: Internal Medicine | Admitting: Internal Medicine

## 2022-12-23 DIAGNOSIS — R918 Other nonspecific abnormal finding of lung field: Secondary | ICD-10-CM

## 2022-12-23 MED ORDER — IOHEXOL 300 MG/ML  SOLN
75.0000 mL | Freq: Once | INTRAMUSCULAR | Status: AC | PRN
  Administered 2022-12-23: 75 mL via INTRAVENOUS

## 2023-03-17 ENCOUNTER — Other Ambulatory Visit (HOSPITAL_COMMUNITY): Payer: Self-pay | Admitting: Internal Medicine

## 2023-03-17 DIAGNOSIS — E1169 Type 2 diabetes mellitus with other specified complication: Secondary | ICD-10-CM

## 2023-03-31 ENCOUNTER — Ambulatory Visit (HOSPITAL_COMMUNITY)
Admission: RE | Admit: 2023-03-31 | Discharge: 2023-03-31 | Disposition: A | Discharge status: Home / Self Care | Payer: 59 | Source: Ambulatory Visit | Attending: Internal Medicine | Admitting: Internal Medicine

## 2023-03-31 DIAGNOSIS — E1169 Type 2 diabetes mellitus with other specified complication: Secondary | ICD-10-CM | POA: Diagnosis present

## 2023-03-31 MED ORDER — IOHEXOL 300 MG/ML  SOLN
100.0000 mL | Freq: Once | INTRAMUSCULAR | Status: AC | PRN
Start: 2023-03-31 — End: 2023-03-31
  Administered 2023-03-31: 100 mL via INTRAVENOUS

## 2023-05-26 ENCOUNTER — Other Ambulatory Visit: Payer: Self-pay | Admitting: *Deleted

## 2023-05-26 DIAGNOSIS — K439 Ventral hernia without obstruction or gangrene: Secondary | ICD-10-CM

## 2023-05-30 ENCOUNTER — Encounter: Payer: Self-pay | Admitting: Surgery

## 2023-05-30 ENCOUNTER — Ambulatory Visit: Payer: 59 | Admitting: Surgery

## 2023-05-30 VITALS — BP 158/113 | HR 82 | Temp 98.1°F | Resp 12 | Ht 69.0 in | Wt 204.0 lb

## 2023-05-30 DIAGNOSIS — K429 Umbilical hernia without obstruction or gangrene: Secondary | ICD-10-CM | POA: Diagnosis not present

## 2023-06-02 NOTE — Progress Notes (Signed)
 [facility] History and Physical  Reason for Referral: Ventral hernia Referring Physician: Dr. Shona  Chief Complaint   New Patient (Initial Visit)     Alexander Mills is a 58 y.o. male.  HPI: Patient presents for evaluation of ventral hernia.  He first noticed this area 2 years ago after doing some heavy lifting at work.  It has been reducible, but over the last 3 months he has noted that it is been coming out more.  He intermittently will have pain that comes and goes with it.  He thinks that his pain is associated with food.  He denies nausea and vomiting.  He is having regular bowel movements.  His past medical history significant for diabetes and hypertension.  He is not taking any blood thinning medications.  He has a history of orthopedic surgeries but denies any history of abdominal surgeries.  He quit smoking 3 years ago.  He drinks 4-5 beers on the average weekday and 12 beers to a case on the weekends.  He denies use of illicit drugs.  Past Medical History:  Diagnosis Date   Hypertension    diet, exercise controlled     Past Surgical History:  Procedure Laterality Date   ANTERIOR CERVICAL DECOMP/DISCECTOMY FUSION  11/11/2011   Procedure: ANTERIOR CERVICAL DECOMPRESSION/DISCECTOMY FUSION 1 LEVEL;  Surgeon: Victory DELENA Gunnels, MD;  Location: [facility];  Service: Neurosurgery;  Laterality: N/A;  Anterior Cervical Decompression/Discectomy Fusion Cervical Three-Four   COLONOSCOPY WITH PROPOFOL  N/A 05/04/2020   Procedure: COLONOSCOPY WITH PROPOFOL ;  Surgeon: Cindie Carlin POUR, DO;  Location: [facility];  Service: Endoscopy;  Laterality: N/A;  8:30   NECK SURGERY     POLYPECTOMY  05/04/2020   Procedure: POLYPECTOMY;  Surgeon: Cindie Carlin POUR, DO;  Location: [facility];  Service: Endoscopy;;   SHOULDER SURGERY     October 28, 2011    No family history on file.  Social History   Tobacco Use   Smoking status: Former    Current packs/day: 0.00    Average  packs/day: 1 pack/day for 15.0 years (15.0 ttl pk-yrs)    Types: Cigarettes    Quit date: 2022    Years since quitting: 3.1   Smokeless tobacco: Never  Vaping Use   Vaping status: Never Used  Substance Use Topics   Alcohol use: Yes    Comment: occassional   Drug use: No    Medications: I have reviewed the patient's current medications. Allergies as of 05/30/2023   No Known Allergies      Medication List        Accurate as of May 30, 2023 11:59 PM. If you have any questions, ask your nurse or doctor.          metFORMIN 500 MG tablet Commonly known as: GLUCOPHAGE Take 500 mg by mouth daily with breakfast.         ROS:  Constitutional: negative for chills, fatigue, and fevers Eyes: negative for visual disturbance and pain Ears, nose, mouth, throat, and face: negative for ear drainage, sore throat, and sinus problems Respiratory: negative for cough, wheezing, and shortness of breath Cardiovascular: negative for chest pain and palpitations Gastrointestinal: negative for abdominal pain, nausea, reflux symptoms, and vomiting Genitourinary:negative for dysuria and frequency Integument/breast: negative for dryness and rash Hematologic/lymphatic: negative for bleeding and lymphadenopathy Musculoskeletal:positive for back pain and neck pain Neurological: negative for dizziness and tremors Endocrine: negative for temperature intolerance  Blood pressure (!) 158/113, pulse 82, temperature  98.1 F (36.7 C), temperature source Oral, resp. rate 12, height 5' 9 (1.753 m), weight 204 lb (92.5 kg), SpO2 97%. Physical Exam  Results: CT of the abdomen and pelvis (03/31/2023): IMPRESSION: 1. Diffuse hepatic steatosis with a nodular area of more prominent fatty infiltration along the falciform ligament. 2. Arterially enhancing 13 mm lesion in the right lobe of the liver with nonspecific imaging characteristics but which almost certainly reflects a primary hepatic lesion and  in the absence of cirrhotic morphology or known risk factors is favored to reflect a benign lesion such as an adenoma or focal nodular hyperplasia. Suggest clinical correlation for HCC risk factors, in the absence of clinical discovery of risk factors suggest a follow-up abdominal MRI with and without contrast in 3 months. 3. Small hiatal hernia. 4. Colonic diverticulosis without findings of acute diverticulitis. 5. Fat containing ventral hernia. 6.  Aortic Atherosclerosis (ICD10-I70.0).  Assessment & Plan:  Alexander Mills is a 58 y.o. male who presents for evaluation of a umbilical/ventral hernia  -I discussed the pathophysiology of hernias and we discussed the need for surgical repair -The risk and benefits of robotic assisted laparoscopic umbilical hernia repair with mesh were discussed including but not limited to bleeding, infection, injury to surrounding structures, need for additional procedures, and hernia recurrence.  He would like to think about the surgery and will call us  about scheduling -Information provided to the patient regarding umbilical hernias -Advised that the patient should present to the ED if they begin to have a painful nonreducible bulge in his abdomen, nausea, vomiting, and obstipation  All questions were answered to the satisfaction of the patient.  Dorothyann Brittle, DO [facility] [address] [place] [address], [place] [address] [phone] (office)

## 2023-06-26 ENCOUNTER — Other Ambulatory Visit (HOSPITAL_COMMUNITY): Payer: Self-pay | Admitting: Internal Medicine

## 2023-06-26 DIAGNOSIS — K769 Liver disease, unspecified: Secondary | ICD-10-CM

## 2023-07-21 ENCOUNTER — Encounter (HOSPITAL_COMMUNITY): Payer: Self-pay

## 2023-07-21 ENCOUNTER — Ambulatory Visit (HOSPITAL_COMMUNITY)
Admission: RE | Admit: 2023-07-21 | Discharge: 2023-07-21 | Disposition: A | Discharge status: Home / Self Care | Source: Ambulatory Visit | Attending: Internal Medicine | Admitting: Internal Medicine

## 2023-07-21 DIAGNOSIS — K769 Liver disease, unspecified: Secondary | ICD-10-CM

## 2023-11-24 ENCOUNTER — Other Ambulatory Visit (HOSPITAL_COMMUNITY): Payer: Self-pay | Admitting: Internal Medicine

## 2023-11-24 DIAGNOSIS — K769 Liver disease, unspecified: Secondary | ICD-10-CM

## 2023-11-24 DIAGNOSIS — F17201 Nicotine dependence, unspecified, in remission: Secondary | ICD-10-CM

## 2024-01-18 ENCOUNTER — Ambulatory Visit (HOSPITAL_COMMUNITY)
Admission: RE | Admit: 2024-01-18 | Discharge: 2024-01-18 | Disposition: A | Discharge status: Home / Self Care | Source: Ambulatory Visit | Attending: Internal Medicine | Admitting: Internal Medicine

## 2024-01-18 DIAGNOSIS — K769 Liver disease, unspecified: Secondary | ICD-10-CM | POA: Diagnosis present

## 2024-01-18 MED ORDER — GADOBUTROL 1 MMOL/ML IV SOLN
9.0000 mL | Freq: Once | INTRAVENOUS | Status: AC | PRN
  Administered 2024-01-18: 9 mL via INTRAVENOUS

## 2024-01-25 ENCOUNTER — Encounter: Payer: Self-pay | Admitting: Internal Medicine

## 2024-02-29 ENCOUNTER — Ambulatory Visit: Admitting: Internal Medicine

## 2024-02-29 VITALS — BP 173/91 | HR 78 | Temp 98.4°F | Ht 70.0 in | Wt 213.8 lb

## 2024-02-29 DIAGNOSIS — R7989 Other specified abnormal findings of blood chemistry: Secondary | ICD-10-CM | POA: Diagnosis not present

## 2024-02-29 DIAGNOSIS — Z860100 Personal history of colon polyps, unspecified: Secondary | ICD-10-CM

## 2024-02-29 DIAGNOSIS — K769 Liver disease, unspecified: Secondary | ICD-10-CM | POA: Diagnosis not present

## 2024-02-29 DIAGNOSIS — K760 Fatty (change of) liver, not elsewhere classified: Secondary | ICD-10-CM

## 2024-02-29 NOTE — Patient Instructions (Signed)
 I am going to order blood work today at [facility] to further evaluate your liver.  We will call with results.  We will plan on repeat MRI of your liver March 2026.  Follow-up in 3 months.  It was very nice seeing you again today.  Dr. Cindie

## 2024-02-29 NOTE — Progress Notes (Signed)
 Primary Care Physician:  Shona Norleen PEDLAR, MD Primary Gastroenterologist:  Dr. Cindie  Chief Complaint  Patient presents with   New Patient (Initial Visit)    Referred for liver disease    HPI:   Alexander Mills is a 58 y.o. male who presents to clinic today by referral from his PCP Dr. Shona for evaluation for hepatic steatosis, liver lesions.  CT abdomen pelvis 03/31/2023 showed diffuse hepatic steatosis with nodular area of fatty infiltration along the falciform ligament.  Arterial enhancing 13 mm lesion in the right lobe of the liver, small hiatal hernia, diverticulosis, fat-containing ventral hernia.  MRI abdomen 01/18/2024 showed 8 x 10 mm lesion corresponding with prior CT scan favored to be FNH or flash-filling hemangioma.  Additional lesion in the right hepatic lobe incompletely characterized but favored to represent hepatic adenoma versus H&H.  Recommended MRI for follow-up in 6 months.  Most recent blood work 11/17/2023 AST 25, ALT 44, T. bili 0.4, alk phos 51, albumin 4.6, hemoglobin 12.8, platelets 339.  Patient endorses daily alcohol consumption, 4-5 beers a day.  12 or more beers a day on the weekends.  Also has risk factors for MASH including being overweight (BMI 30.68), diabetes, hypertension, dyslipidemia.  No family history of liver disease.  No exposure or viral hepatitis that he is aware of.  No illicit drug use.  Occasionally puts turmeric in his smoothies otherwise no other concerning herbal supplements.  Procedure history: Colonoscopy 05/04/2020 for screening, 2 polyps removed 1 tubular adenoma, 1 sessile serrated polyp.  Recommended 5-year recall.  Past Medical History:  Diagnosis Date   Hypertension    diet, exercise controlled     Past Surgical History:  Procedure Laterality Date   ANTERIOR CERVICAL DECOMP/DISCECTOMY FUSION  11/11/2011   Procedure: ANTERIOR CERVICAL DECOMPRESSION/DISCECTOMY FUSION 1 LEVEL;  Surgeon: Victory DELENA Gunnels, MD;  Location: [facility];   Service: Neurosurgery;  Laterality: N/A;  Anterior Cervical Decompression/Discectomy Fusion Cervical Three-Four   COLONOSCOPY WITH PROPOFOL  N/A 05/04/2020   Procedure: COLONOSCOPY WITH PROPOFOL ;  Surgeon: Cindie Carlin POUR, DO;  Location: [facility];  Service: Endoscopy;  Laterality: N/A;  8:30   NECK SURGERY     POLYPECTOMY  05/04/2020   Procedure: POLYPECTOMY;  Surgeon: Cindie Carlin POUR, DO;  Location: [facility];  Service: Endoscopy;;   SHOULDER SURGERY     October 28, 2011    Current Outpatient Medications  Medication Sig Dispense Refill   metFORMIN (GLUCOPHAGE) 500 MG tablet Take 500 mg by mouth daily with breakfast.     telmisartan (MICARDIS) 80 MG tablet Take 80 mg by mouth daily.     No current facility-administered medications for this visit.    Allergies as of 02/29/2024   (No Known Allergies)    No family history on file.  Social History   Socioeconomic History   Marital status: Married    Spouse name: Not on file   Number of children: Not on file   Years of education: Not on file   Highest education level: Not on file  Occupational History   Not on file  Tobacco Use   Smoking status: Former    Current packs/day: 0.00    Average packs/day: 1 pack/day for 15.0 years (15.0 ttl pk-yrs)    Types: Cigarettes    Quit date: 2022    Years since quitting: 3.8   Smokeless tobacco: Never  Vaping Use   Vaping status: Never Used  Substance and Sexual Activity  Alcohol use: Yes    Comment: occassional   Drug use: No   Sexual activity: Yes  Other Topics Concern   Not on file  Social History Narrative   Not on file   Social Drivers of Health   Financial Resource Strain: Not on file  Food Insecurity: Not on file  Transportation Needs: Not on file  Physical Activity: Not on file  Stress: Not on file  Social Connections: Not on file  Intimate Partner Violence: Not on file    Subjective: Review of Systems  Constitutional:  Negative for chills and fever.   HENT:  Negative for congestion and hearing loss.   Eyes:  Negative for blurred vision and double vision.  Respiratory:  Negative for cough and shortness of breath.   Cardiovascular:  Negative for chest pain and palpitations.  Gastrointestinal:  Negative for abdominal pain, blood in stool, constipation, diarrhea, heartburn, melena and vomiting.  Genitourinary:  Negative for dysuria and urgency.  Musculoskeletal:  Negative for joint pain and myalgias.  Skin:  Negative for itching and rash.  Neurological:  Negative for dizziness and headaches.  Psychiatric/Behavioral:  Negative for depression. The patient is not nervous/anxious.        Objective: BP (!) 173/91   Pulse 78   Temp 98.4 F (36.9 C)   Ht 5' 10 (1.778 m)   Wt 213 lb 12.8 oz (97 kg)   BMI 30.68 kg/m  Physical Exam Constitutional:      Appearance: Normal appearance.  HENT:     Head: Normocephalic and atraumatic.  Eyes:     Extraocular Movements: Extraocular movements intact.     Conjunctiva/sclera: Conjunctivae normal.  Cardiovascular:     Rate and Rhythm: Normal rate and regular rhythm.  Pulmonary:     Effort: Pulmonary effort is normal.     Breath sounds: Normal breath sounds.  Abdominal:     General: Bowel sounds are normal.     Palpations: Abdomen is soft.  Musculoskeletal:        General: Normal range of motion.     Cervical back: Normal range of motion and neck supple.  Skin:    General: Skin is warm.  Neurological:     General: No focal deficit present.     Mental Status: He is alert and oriented to person, place, and time.  Psychiatric:        Mood and Affect: Mood normal.        Behavior: Behavior normal.      Assessment: *Hepatic steatosis *Elevated LFTs *Liver lesions *Colon polyps  Plan: Discussed in depth with patient today.  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  Will perform further serological workup today as well as FibroSure testing.  Counseled on decreasing his  alcohol consumption.  Will plan on surveillance MRI March 2026 to reevaluate his 2 liver lesions.  Colonoscopy recall January 2027.  Follow-up in 3 months or sooner if needed.  Thank you Dr. Shona for the kind referral.  02/29/2024 12:08 PM

## 2024-03-01 LAB — AFP TUMOR MARKER: AFP, Serum, Tumor Marker: 6.1 ng/mL (ref 0.0–8.4)

## 2024-03-04 LAB — CELIAC AB TTG DGP TIGA
Deamidated Gliadin Abs, IgA: 3 U (ref 0–19)
Deamidated Gliadin Abs, IgG: 1 U (ref 0–19)
Immunoglobulin A, (IgA) QN, Serum: 164 mg/dL (ref 90–386)
t-Transglutaminase (tTG) IgA: <2 U/mL (ref 0–3)
t-Transglutaminase (tTG) IgG: 6 U/mL — ABNORMAL HIGH (ref 0–5)

## 2024-03-04 LAB — IMMUNOGLOBULINS A/E/G/M, SERUM
IgE (Immunoglobulin E), Serum: 121 [IU]/mL (ref 6–495)
IgG (Immunoglobin G), Serum: 1250 mg/dL (ref 603–1613)
IgM (Immunoglobulin M), Srm: 86 mg/dL (ref 20–172)

## 2024-03-20 ENCOUNTER — Telehealth: Payer: Self-pay

## 2024-03-20 NOTE — Telephone Encounter (Signed)
 Pt called wanting results from labs on 03/02/2024. Please advise

## 2024-04-12 ENCOUNTER — Other Ambulatory Visit (HOSPITAL_COMMUNITY): Payer: Self-pay | Admitting: Internal Medicine

## 2024-04-12 DIAGNOSIS — F17201 Nicotine dependence, unspecified, in remission: Secondary | ICD-10-CM

## 2024-04-15 ENCOUNTER — Ambulatory Visit: Payer: Self-pay | Admitting: Internal Medicine

## 2024-04-30 ENCOUNTER — Encounter: Payer: Self-pay | Admitting: Internal Medicine
